# Patient Record
Sex: Female | Born: 1970 | Race: White | Hispanic: No | Marital: Married | Smoking: Never smoker
Health system: Southern US, Community
[De-identification: ages and names within clinical notes are randomized; demographics above are authoritative.]

## PROBLEM LIST (undated history)

## (undated) DIAGNOSIS — N870 Mild cervical dysplasia: Secondary | ICD-10-CM

## (undated) DIAGNOSIS — G43009 Migraine without aura, not intractable, without status migrainosus: Secondary | ICD-10-CM

## (undated) DIAGNOSIS — R011 Cardiac murmur, unspecified: Secondary | ICD-10-CM

## (undated) DIAGNOSIS — I370 Nonrheumatic pulmonary valve stenosis: Secondary | ICD-10-CM

## (undated) DIAGNOSIS — Q256 Stenosis of pulmonary artery: Secondary | ICD-10-CM

## (undated) HISTORY — DX: Migraine without aura, not intractable, without status migrainosus: G43.009

## (undated) HISTORY — DX: Nonrheumatic pulmonary valve stenosis: I37.0

## (undated) HISTORY — DX: Mild cervical dysplasia: N87.0

## (undated) HISTORY — DX: Stenosis of pulmonary artery: Q25.6

## (undated) HISTORY — DX: Cardiac murmur, unspecified: R01.1

---
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
## 2008-10-22 DIAGNOSIS — N870 Mild cervical dysplasia: Secondary | ICD-10-CM

## 2008-10-22 HISTORY — DX: Mild cervical dysplasia: N87.0

## 2008-11-03 ENCOUNTER — Other Ambulatory Visit: Admission: RE | Admit: 2008-11-03 | Discharge: 2008-11-03 | Payer: Self-pay | Admitting: Obstetrics and Gynecology

## 2009-02-04 ENCOUNTER — Other Ambulatory Visit: Admission: RE | Admit: 2009-02-04 | Discharge: 2009-02-04 | Payer: Self-pay | Admitting: Obstetrics and Gynecology

## 2010-08-09 ENCOUNTER — Ambulatory Visit (HOSPITAL_COMMUNITY): Admission: RE | Admit: 2010-08-09 | Discharge: 2010-08-09 | Payer: Self-pay | Admitting: Obstetrics and Gynecology

## 2010-12-04 ENCOUNTER — Institutional Professional Consult (permissible substitution): Payer: Self-pay | Admitting: Cardiology

## 2011-01-03 ENCOUNTER — Institutional Professional Consult (permissible substitution): Payer: Self-pay | Admitting: Cardiology

## 2011-01-04 ENCOUNTER — Institutional Professional Consult (permissible substitution): Payer: Self-pay | Admitting: Cardiology

## 2011-02-07 ENCOUNTER — Encounter: Payer: Self-pay | Admitting: Cardiology

## 2011-02-07 ENCOUNTER — Ambulatory Visit (INDEPENDENT_AMBULATORY_CARE_PROVIDER_SITE_OTHER): Payer: BC Managed Care – PPO | Admitting: Cardiology

## 2011-02-07 VITALS — BP 110/84 | HR 69 | Ht 64.0 in | Wt 130.2 lb

## 2011-02-07 DIAGNOSIS — R011 Cardiac murmur, unspecified: Secondary | ICD-10-CM | POA: Insufficient documentation

## 2011-02-07 DIAGNOSIS — I379 Nonrheumatic pulmonary valve disorder, unspecified: Secondary | ICD-10-CM

## 2011-02-07 DIAGNOSIS — I370 Nonrheumatic pulmonary valve stenosis: Secondary | ICD-10-CM | POA: Insufficient documentation

## 2011-02-07 NOTE — Patient Instructions (Signed)
We will schedule you for an Echocardiogram to follow up on your pulmonic stenosis.  If stable I would just recommend follow up every 5 years unless you have new symptoms.

## 2011-02-07 NOTE — Progress Notes (Signed)
   Dawn Booth Date of Birth: 1971/09/15   History of Present Illness: Dawn Booth is a very pleasant 40 year old white female who is self-referred for evaluation of pulmonic stenosis. She had a murmur noted at birth. At age 4 she underwent cardiac catheterization. She has been diagnosed with mild pulmonic stenosis. Her last evaluation was in 2008 which demonstrated a peak velocity across the aortic valve of 1.5 m/s. Right-sided chambers were normal at that time. She denies any cardiac complaints. She remains very active. She denies any chest pain, shortness of breath, palpitations, or dizziness. She's had no edema. Her weight has been stable.  Current Outpatient Prescriptions on File Prior to Visit  Medication Sig Dispense Refill  . drospirenone-ethinyl estradiol (YAZ) 3-0.02 MG per tablet Take 1 tablet by mouth daily.  30 tablet      Allergies  Allergen Reactions  . Codeine     Past Medical History  Diagnosis Date  . Pulmonary stenosis   . Heart murmur     Past Surgical History  Procedure Date  . Cardiac catheterization 1975    History  Smoking status  . Never Smoker   Smokeless tobacco  . Not on file    History  Alcohol Use  . Yes    OCCASSIONALLY    History reviewed. No pertinent family history.  Review of Systems:  All other systems were reviewed and are negative.  Physical Exam: BP 110/84  Pulse 69  Ht 5\' 4"  (1.626 m)  Wt 130 lb 3.2 oz (59.058 kg)  BMI 22.35 kg/m2 The patient is alert and oriented x 3.  The mood and affect are normal.  The skin is warm and dry.  Color is normal.  The HEENT exam reveals that the sclera are nonicteric.  The mucous membranes are moist.  The carotids are 2+ without bruits.  There is no thyromegaly.  There is no JVD.  The lungs are clear.  The chest wall is non tender.  The heart exam reveals a regular rate with a normal S1 and S2.  There is a grade 2/6 systolic ejection murmur heard best at the left upper sternal border. There is no  diastolic murmur. There is no right ventricular lift. The PMI is not displaced.   Abdominal exam reveals good bowel sounds.  There is no guarding or rebound.  There is no hepatosplenomegaly or tenderness.  There are no masses.  Exam of the legs reveal no clubbing, cyanosis, or edema.  The legs are without rashes.  The distal pulses are intact.  Cranial nerves II - XII are intact.  Motor and sensory functions are intact.  The gait is normal.  LABORATORY DATA: ECG demonstrates normal sinus rhythm with a rate of 69 beats per minute. It is a normal ECG.  Assessment / Plan:

## 2011-02-07 NOTE — Assessment & Plan Note (Signed)
Exam is consistent with mild pulmonic stenosis. We will obtain an echocardiogram to followup. If this is stable then I would recommend repeat evaluation in 5 years. The patient has been using SBE prophylaxis but I informed her that based on the current guidelines this was no longer recommended.

## 2011-02-15 ENCOUNTER — Ambulatory Visit (HOSPITAL_COMMUNITY): Payer: BC Managed Care – PPO | Attending: Cardiology

## 2011-02-15 DIAGNOSIS — I379 Nonrheumatic pulmonary valve disorder, unspecified: Secondary | ICD-10-CM | POA: Insufficient documentation

## 2011-02-15 DIAGNOSIS — I370 Nonrheumatic pulmonary valve stenosis: Secondary | ICD-10-CM

## 2011-02-16 ENCOUNTER — Telehealth: Payer: Self-pay | Admitting: *Deleted

## 2011-02-16 NOTE — Telephone Encounter (Signed)
Notified of echo results and needs repeat echo in 4-5 yrs. No PCP

## 2011-02-16 NOTE — Telephone Encounter (Signed)
Message copied by Murrell Redden on Fri Feb 16, 2011  2:40 PM ------      Message from: [place], PETER      Created: Thu Feb 15, 2011  4:40 PM       Mild pulmonic stenosis. No change in gradient from prior exam. Right heart is normal.      Recommend follow up Echo/doppler in 4-5 years.

## 2011-05-03 ENCOUNTER — Encounter: Payer: Self-pay | Admitting: Cardiology

## 2011-08-17 ENCOUNTER — Other Ambulatory Visit: Payer: Self-pay | Admitting: Obstetrics and Gynecology

## 2011-08-17 DIAGNOSIS — Z1231 Encounter for screening mammogram for malignant neoplasm of breast: Secondary | ICD-10-CM

## 2011-09-17 ENCOUNTER — Ambulatory Visit (HOSPITAL_COMMUNITY)
Admission: RE | Admit: 2011-09-17 | Discharge: 2011-09-17 | Disposition: A | Discharge status: Home / Self Care | Payer: No Typology Code available for payment source | Source: Ambulatory Visit | Attending: Obstetrics and Gynecology | Admitting: Obstetrics and Gynecology

## 2011-09-17 DIAGNOSIS — Z1231 Encounter for screening mammogram for malignant neoplasm of breast: Secondary | ICD-10-CM | POA: Insufficient documentation

## 2012-09-03 ENCOUNTER — Other Ambulatory Visit: Payer: Self-pay | Admitting: Obstetrics and Gynecology

## 2012-09-03 DIAGNOSIS — Z1231 Encounter for screening mammogram for malignant neoplasm of breast: Secondary | ICD-10-CM

## 2012-10-06 ENCOUNTER — Ambulatory Visit (HOSPITAL_COMMUNITY)
Admission: RE | Admit: 2012-10-06 | Discharge: 2012-10-06 | Disposition: A | Discharge status: Home / Self Care | Payer: No Typology Code available for payment source | Source: Ambulatory Visit | Attending: Obstetrics and Gynecology | Admitting: Obstetrics and Gynecology

## 2012-10-06 DIAGNOSIS — Z1231 Encounter for screening mammogram for malignant neoplasm of breast: Secondary | ICD-10-CM | POA: Insufficient documentation

## 2013-03-30 ENCOUNTER — Encounter: Payer: Self-pay | Admitting: Obstetrics and Gynecology

## 2013-03-30 DIAGNOSIS — Q256 Stenosis of pulmonary artery: Secondary | ICD-10-CM | POA: Insufficient documentation

## 2013-04-03 ENCOUNTER — Ambulatory Visit (INDEPENDENT_AMBULATORY_CARE_PROVIDER_SITE_OTHER): Payer: No Typology Code available for payment source | Admitting: Gynecology

## 2013-04-03 ENCOUNTER — Ambulatory Visit: Payer: Self-pay | Admitting: Obstetrics and Gynecology

## 2013-04-03 ENCOUNTER — Encounter: Payer: Self-pay | Admitting: Gynecology

## 2013-04-03 VITALS — BP 110/64 | HR 80 | Resp 18 | Ht 64.5 in | Wt 130.0 lb

## 2013-04-03 DIAGNOSIS — Z124 Encounter for screening for malignant neoplasm of cervix: Secondary | ICD-10-CM

## 2013-04-03 DIAGNOSIS — Z0000 Encounter for general adult medical examination without abnormal findings: Secondary | ICD-10-CM

## 2013-04-03 DIAGNOSIS — Z01419 Encounter for gynecological examination (general) (routine) without abnormal findings: Secondary | ICD-10-CM

## 2013-04-03 LAB — POCT URINALYSIS DIPSTICK
Urobilinogen, UA: NEGATIVE
pH, UA: 5

## 2013-04-03 NOTE — Patient Instructions (Addendum)

## 2013-04-03 NOTE — Progress Notes (Signed)
42 y.o. Married Caucasian female  G2P2, was on yaz to treat migraines but stopped last year and cycles have been normal, migraines, spotting better here for annual exam. Pt is currently sexually active.    Patient's last menstrual period was 03/07/2013.          Sexually active: yes  The current method of family planning is vasectomy.    Exercising: yes  crossfit 3x/wk Last pap: 11/28/10 Alcohol: 3 drinks/wk Tobacco: no BSE: no  Hgb: 13.6 Urine: Trace Leuks   Health Maintenance  Topic Date Due  . Influenza Vaccine  06/22/2013  . Pap Smear  11/28/2013  . Tetanus/tdap  08/06/2019    No family history on file.  Patient Active Problem List   Diagnosis Date Noted  . Congenital pulmonary stenosis   . Pulmonary stenosis   . Heart murmur     Past Medical History  Diagnosis Date  . Pulmonary stenosis   . Heart murmur   . 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 Congenital pulmonary stenosis   . Migraine with aura     Past Surgical History  Procedure Laterality Date  . Cardiac catheterization  1975    Allergies: Codeine  Current Outpatient Prescriptions  Medication Sig Dispense Refill  . IBUPROFEN PO Take by mouth.      . levocetirizine (XYZAL) 2.5 MG/5ML solution Take 5 mLs (2.5 mg total) by mouth every evening.  148 mL    . SUMAtriptan (IMITREX) 100 MG tablet       . drospirenone-ethinyl estradiol (YAZ) 3-0.02 MG per tablet Take 1 tablet by mouth daily.  30 tablet     No current facility-administered medications for this visit.    ROS: Pertinent items are noted in HPI.  Exam:    BP 110/64  Pulse 80  Resp 18  Ht 5' 4.5" (1.638 m)  Wt 130 lb (58.968 kg)  BMI 21.98 kg/m2  LMP 03/07/2013 Weight change: @WEIGHTCHANGE @ Last 3 height recordings:  Ht Readings from Last 3 Encounters:  04/03/13 5' 4.5" (1.638 m)  02/07/11 5\' 4"  (1.626 m)   General appearance: alert, cooperative and appears stated age Head: Normocephalic, without  obvious abnormality, atraumatic Neck: no adenopathy, no carotid bruit, no JVD, supple, symmetrical, trachea midline and thyroid not enlarged, symmetric, no tenderness/mass/nodules Lungs: clear to auscultation bilaterally Breasts: normal appearance, no masses or tenderness Heart: regular rate and rhythm, S1, S2 normal, no murmur, click, rub or gallop Abdomen: soft, non-tender; bowel sounds normal; no masses,  no organomegaly Extremities: extremities normal, atraumatic, no cyanosis or edema Skin: Skin color, texture, turgor normal. No rashes or lesions Lymph nodes: Cervical, supraclavicular, and axillary nodes normal. no inguinal nodes palpated Neurologic: Grossly normal   Pelvic: External genitalia:  no lesions              Urethra: normal appearing urethra with no masses, tenderness or lesions              Bartholins and Skenes: normal                 Vagina: normal appearing vagina with normal color and discharge, no lesions              Cervix: normal appearance              Pap taken: no        Bimanual Exam:  Uterus:  uterus is normal size, shape, consistency and  nontender                                      Adnexa:    normal adnexa in size, nontender and no masses                                      Rectovaginal: Confirms                                      Anus:  normal sphincter tone, no lesions  A: well woman Pulmonary stenosis-stable     P: mammogram q year PAP with HPV due 2015 return annually or prn   An After Visit Summary was printed and given to the patient.

## 2013-04-08 NOTE — Addendum Note (Signed)
Addended by: Clide Dales R on: 04/08/2013 09:51 AM   Modules accepted: Orders

## 2013-04-25 ENCOUNTER — Other Ambulatory Visit (HOSPITAL_COMMUNITY): Payer: Self-pay | Admitting: Obstetrics and Gynecology

## 2013-04-27 MED ORDER — SUMATRIPTAN SUCCINATE 100 MG PO TABS: ORAL_TABLET | ORAL | Status: DC

## 2013-04-27 NOTE — Addendum Note (Signed)
Addended by: Douglass Rivers on: 04/27/2013 06:31 PM   Modules accepted: Orders

## 2013-04-27 NOTE — Telephone Encounter (Signed)
Patient was just seen for AEX 04/03/13 no rx was given/sent through please advise.

## 2013-10-26 ENCOUNTER — Other Ambulatory Visit: Payer: Self-pay | Admitting: Gynecology

## 2013-10-26 DIAGNOSIS — Z1231 Encounter for screening mammogram for malignant neoplasm of breast: Secondary | ICD-10-CM

## 2013-11-03 ENCOUNTER — Ambulatory Visit (HOSPITAL_COMMUNITY)
Admission: RE | Admit: 2013-11-03 | Discharge: 2013-11-03 | Disposition: A | Discharge status: Home / Self Care | Payer: No Typology Code available for payment source | Source: Ambulatory Visit | Attending: Gynecology | Admitting: Gynecology

## 2013-11-03 DIAGNOSIS — Z1231 Encounter for screening mammogram for malignant neoplasm of breast: Secondary | ICD-10-CM | POA: Insufficient documentation

## 2014-03-24 ENCOUNTER — Other Ambulatory Visit (HOSPITAL_COMMUNITY): Payer: Self-pay | Admitting: Gynecology

## 2014-03-24 NOTE — Telephone Encounter (Signed)
Last AEX 04/03/13 Last refill 04/27/13 #27/ 0 refills Next appt 04/05/14  Please approve or deny Rx.

## 2014-04-05 ENCOUNTER — Ambulatory Visit: Payer: No Typology Code available for payment source | Admitting: Gynecology

## 2014-04-14 ENCOUNTER — Ambulatory Visit (INDEPENDENT_AMBULATORY_CARE_PROVIDER_SITE_OTHER): Payer: No Typology Code available for payment source | Admitting: Gynecology

## 2014-04-14 ENCOUNTER — Encounter: Payer: Self-pay | Admitting: Gynecology

## 2014-04-14 VITALS — Ht 64.75 in | Wt 130.0 lb

## 2014-04-14 DIAGNOSIS — Z124 Encounter for screening for malignant neoplasm of cervix: Secondary | ICD-10-CM

## 2014-04-14 DIAGNOSIS — Z0000 Encounter for general adult medical examination without abnormal findings: Secondary | ICD-10-CM

## 2014-04-14 DIAGNOSIS — Z01419 Encounter for gynecological examination (general) (routine) without abnormal findings: Secondary | ICD-10-CM

## 2014-04-14 DIAGNOSIS — N926 Irregular menstruation, unspecified: Secondary | ICD-10-CM

## 2014-04-14 DIAGNOSIS — N921 Excessive and frequent menstruation with irregular cycle: Secondary | ICD-10-CM

## 2014-04-14 LAB — POCT URINALYSIS DIPSTICK
Leukocytes, UA: NEGATIVE
UROBILINOGEN UA: NEGATIVE
pH, UA: 5

## 2014-04-14 LAB — HEMOGLOBIN, FINGERSTICK: HEMOGLOBIN, FINGERSTICK: 13 g/dL (ref 12.0–16.0)

## 2014-04-14 NOTE — Progress Notes (Signed)
43 y.o. Married Caucasian female   G2P2002 here for annual exam. Pt is currently sexually active.  Periods are now more irregular, every 2w-4w, 4d of flow and no dysmeorrhea  Patient's last menstrual period was 03/09/2014.          Sexually active: yes  The current method of family planning is Husband has Vasectomy.    Exercising: yes  crossfit, cardio, weights 4x/wk Last pap: 11/28/10 NEG, 02/04/09 NEG HR HPV Alcohol:2-3 drinks/wk Tobacco: no BSE: yes  Last Mammogram:11/04/13 Bi-Rads 1  Hgb: 13.0 ; Urine: Negative   Health Maintenance  Topic Date Due  . Influenza Vaccine  05/22/2014  . Pap Smear  04/03/2016  . Tetanus/tdap  08/06/2019    History reviewed. No pertinent family history.  Patient Active Problem List   Diagnosis Date Noted  . Congenital pulmonary stenosis   . Pulmonary stenosis   . Heart murmur     Past Medical History  Diagnosis Date  . Pulmonary stenosis   . Heart murmur   . 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 Congenital pulmonary stenosis   . Migraine with aura     Past Surgical History  Procedure Laterality Date  . Cardiac catheterization  1975    Allergies: Codeine  Current Outpatient Prescriptions  Medication Sig Dispense Refill  . levocetirizine (XYZAL) 2.5 MG/5ML solution Take 5 mLs (2.5 mg total) by mouth every evening.  148 mL    . SUMAtriptan (IMITREX) 100 MG tablet TAKE ONE TABLET BY MOUTH AT ONSET OF HEADACHE  27 tablet  0  . IBUPROFEN PO Take by mouth.       No current facility-administered medications for this visit.    ROS: Pertinent items are noted in HPI.  Exam:    Ht 5' 4.75" (1.645 m)  Wt 130 lb (58.968 kg)  BMI 21.79 kg/m2  LMP 03/09/2014 Weight change: @WEIGHTCHANGE @ Last 3 height recordings:  Ht Readings from Last 3 Encounters:  04/14/14 5' 4.75" (1.645 m)  04/03/13 5' 4.5" (1.638 m)  02/07/11 5\' 4"  (1.626 m)   General appearance: alert, cooperative and appears stated age Head:  Normocephalic, without obvious abnormality, atraumatic Neck: no adenopathy, no carotid bruit, no JVD, supple, symmetrical, trachea midline and thyroid not enlarged, symmetric, no tenderness/mass/nodules Lungs: clear to auscultation bilaterally Breasts: normal appearance, no masses or tenderness Heart: regular rate and rhythm, S1, S2 normal, no murmur, click, rub or gallop Abdomen: soft, non-tender; bowel sounds normal; no masses,  no organomegaly Extremities: extremities normal, atraumatic, no cyanosis or edema Skin: Skin color, texture, turgor normal. No rashes or lesions Lymph nodes: Cervical, supraclavicular, and axillary nodes normal. no inguinal nodes palpated Neurologic: Grossly normal   Pelvic: External genitalia:  normal escutcheon              Urethra: normal appearing urethra with no masses, tenderness or lesions              Bartholins and Skenes: Bartholin's, Urethra, Skene's normal                 Vagina: normal appearing vagina with normal color and discharge, no lesions              Cervix: normal appearance              Pap taken: yes        Bimanual Exam:  Uterus:  uterus is normal size, shape, consistency and nontender  Adnexa:    normal adnexa in size, nontender and no masses                                      Rectovaginal: Confirms                                      Anus:  normal sphincter tone, no lesions     1. Routine gynecological examination counseled on breast self exam, mammography screening, adequate intake of calcium and vitamin D, diet and exercise return annually or prn   2. Laboratory examination ordered as part of a routine general medical examination  - POCT Urinalysis Dipstick - Hemoglobin, fingerstick  3. Screening for cervical cancer  - Pap Test with HP (IPS)  4. Irregular menses Pt with history of irregular cycles, did well initially off ocp, now worse, discussed ddx including anovulation, uterine  pathology Will consider restarting ocp if studies normal, risks and benefits reviewed, no contraindications with pulmonary stenosis  - TSH - Prolactin  5. Metrorrhagia  - US Sonohysterogram; Future   An After Visit Summary was printed and given to the patient.

## 2014-04-15 LAB — PROLACTIN: Prolactin: 8.7 ng/mL

## 2014-04-15 LAB — TSH: TSH: 3.232 u[IU]/mL (ref 0.350–4.500)

## 2014-04-16 ENCOUNTER — Telehealth: Payer: Self-pay | Admitting: *Deleted

## 2014-04-16 LAB — IPS PAP TEST WITH HPV

## 2014-04-16 NOTE — Telephone Encounter (Signed)
Left Message To Call Back  

## 2014-04-16 NOTE — Telephone Encounter (Signed)
Message copied by Lorraine LaxSHAW, JASMINE J on Fri Apr 16, 2014  1:28 PM ------      Message from: Douglass RiversLATHROP, TRACY      Created: Fri Apr 16, 2014  1:14 PM       Labs are normal, keep sonohysterogram appt ------

## 2014-04-20 NOTE — Telephone Encounter (Signed)
Left Message To Call Back  

## 2014-04-20 NOTE — Telephone Encounter (Signed)
Message copied by Lorraine LaxSHAW, Angelika Jerrett J on Tue Apr 20, 2014  8:53 AM ------      Message from: Douglass RiversLATHROP, TRACY      Created: Fri Apr 16, 2014  1:14 PM       Labs are normal, keep sonohysterogram appt ------

## 2014-04-22 ENCOUNTER — Telehealth: Payer: Self-pay | Admitting: Gynecology

## 2014-04-22 NOTE — Telephone Encounter (Signed)
Left Message To Call Back  

## 2014-04-22 NOTE — Telephone Encounter (Signed)
Spoke with patient. Advised that per benefit quote received, she will be responsible for [facility]868.83 [facility] Patient agreeable. Scheduled [facility]. Advised patient of 72 hour cancellation policy and $150 cancellation fee. Patient agreeable Mailed the In-Office procedure form that includes appointment date and time, patient copay, and cancellation policy.

## 2014-04-22 NOTE — Telephone Encounter (Signed)
Left message for patient to call back. Need to go over benefits and scheduled [facility]. °

## 2014-04-26 ENCOUNTER — Encounter: Payer: Self-pay | Admitting: *Deleted

## 2014-04-26 NOTE — Telephone Encounter (Signed)
LMTCB on Husband's cell DPR signed in chart since I haven't been able to get a hold of patient.

## 2014-04-26 NOTE — Telephone Encounter (Signed)
Dawn Booth a lady named Dawn Booth more called and said that we have been calling her number to reach this patient [phone] is not Dawn Booth #

## 2014-04-27 NOTE — Telephone Encounter (Signed)
Patient notified see result note 

## 2014-05-11 ENCOUNTER — Other Ambulatory Visit: Payer: No Typology Code available for payment source | Admitting: Gynecology

## 2014-05-11 ENCOUNTER — Ambulatory Visit: Payer: No Typology Code available for payment source

## 2014-05-18 ENCOUNTER — Other Ambulatory Visit: Payer: Self-pay | Admitting: Gynecology

## 2014-05-18 ENCOUNTER — Ambulatory Visit (INDEPENDENT_AMBULATORY_CARE_PROVIDER_SITE_OTHER): Payer: No Typology Code available for payment source

## 2014-05-18 ENCOUNTER — Ambulatory Visit (INDEPENDENT_AMBULATORY_CARE_PROVIDER_SITE_OTHER): Payer: No Typology Code available for payment source | Admitting: Gynecology

## 2014-05-18 ENCOUNTER — Encounter: Payer: Self-pay | Admitting: Gynecology

## 2014-05-18 VITALS — BP 96/60 | Ht 64.75 in | Wt 128.0 lb

## 2014-05-18 DIAGNOSIS — N83209 Unspecified ovarian cyst, unspecified side: Secondary | ICD-10-CM

## 2014-05-18 DIAGNOSIS — N921 Excessive and frequent menstruation with irregular cycle: Secondary | ICD-10-CM

## 2014-05-18 DIAGNOSIS — R1904 Left lower quadrant abdominal swelling, mass and lump: Secondary | ICD-10-CM

## 2014-05-18 DIAGNOSIS — N926 Irregular menstruation, unspecified: Secondary | ICD-10-CM

## 2014-05-18 DIAGNOSIS — N83292 Other ovarian cyst, left side: Secondary | ICD-10-CM

## 2014-05-18 NOTE — Progress Notes (Signed)
      Pt here for irregular menses, cycles every 2-4w, contracepting with vasectomy. PUS images reviewed. uterus is retroverted, NSSC, no fibroids are noted. Left ovary notable for 2 complex cysts with debris, cyst appears immobile and tender-cysts measure 3.5x3.1, 1.5x1.1, simple clear cyst noted as well  Right ovary normal No free fluid  SHG performed: Speculum placed, cervix cleansed with betadine x3, insemination catheter placed and walls distended, no masses noted.  Assessment: Irregular menses Complex ovarian mass  Plan: 1. Reviewed options to treat menses, had done better on ocp in past, pt is not interested in restarting ocp as they are using vasectomy, [facility] 2. Suspect endometrioma on left.  Discussed impact of endometriosis on pelvic pain, fertility and irregular menses.  Pt does not have pain or need fertility.  Can treat with placement of progestin IUD, should confirm with repeat PUS in 2919m.  Discussed endometriosis in general. Questions addressed. Info on IUD given She will consider her options. Additional 453m spent counseling, >50% face to face

## 2014-05-18 NOTE — Patient Instructions (Addendum)
Levonorgestrel intrauterine device (IUD) What is this medicine? LEVONORGESTREL IUD (LEE voe nor jes trel) is a contraceptive (birth control) device. The device is placed inside the uterus by a healthcare professional. It is used to prevent pregnancy and can also be used to treat heavy bleeding that occurs during your period. Depending on the device, it can be used for 3 to 5 years. This medicine may be used for other purposes; ask your health care provider or pharmacist if you have questions. COMMON BRAND NAME(S): LILETTA, Mirena, Skyla What should I tell my health care provider before I take this medicine? They need to know if you have any of these conditions: -abnormal Pap smear -cancer of the breast, uterus, or cervix -diabetes -endometritis -genital or pelvic infection now or in the past -have more than one sexual partner or your partner has more than one partner -heart disease -history of an ectopic or tubal pregnancy -immune system problems -IUD in place -liver disease or tumor -problems with blood clots or take blood-thinners -use intravenous drugs -uterus of unusual shape -vaginal bleeding that has not been explained -an unusual or allergic reaction to levonorgestrel, other hormones, silicone, or polyethylene, medicines, foods, dyes, or preservatives -pregnant or trying to get pregnant -breast-feeding How should I use this medicine? This device is placed inside the uterus by a health care professional. Talk to your pediatrician regarding the use of this medicine in children. Special care may be needed. Overdosage: If you think you have taken too much of this medicine contact a poison control center or emergency room at once. NOTE: This medicine is only for you. Do not share this medicine with others. What if I miss a dose? This does not apply. What may interact with this medicine? Do not take this medicine with any of the following  medications: -amprenavir -bosentan -fosamprenavir This medicine may also interact with the following medications: -aprepitant -barbiturate medicines for inducing sleep or treating seizures -bexarotene -griseofulvin -medicines to treat seizures like carbamazepine, ethotoin, felbamate, oxcarbazepine, phenytoin, topiramate -modafinil -pioglitazone -rifabutin -rifampin -rifapentine -some medicines to treat HIV infection like atazanavir, indinavir, lopinavir, nelfinavir, tipranavir, ritonavir -St. John's wort -warfarin This list may not describe all possible interactions. Give your health care provider a list of all the medicines, herbs, non-prescription drugs, or dietary supplements you use. Also tell them if you smoke, drink alcohol, or use illegal drugs. Some items may interact with your medicine. What should I watch for while using this medicine? Visit your doctor or health care professional for regular check ups. See your doctor if you or your partner has sexual contact with others, becomes HIV positive, or gets a sexual transmitted disease. This product does not protect you against HIV infection (AIDS) or other sexually transmitted diseases. You can check the placement of the IUD yourself by reaching up to the top of your vagina with clean fingers to feel the threads. Do not pull on the threads. It is a good habit to check placement after each menstrual period. Call your doctor right away if you feel more of the IUD than just the threads or if you cannot feel the threads at all. The IUD may come out by itself. You may become pregnant if the device comes out. If you notice that the IUD has come out use a backup birth control method like condoms and call your health care provider. Using tampons will not change the position of the IUD and are okay to use during your period. What side effects may   I notice from receiving this medicine? Side effects that you should report to your doctor or  health care professional as soon as possible: -allergic reactions like skin rash, itching or hives, swelling of the face, lips, or tongue -fever, flu-like symptoms -genital sores -high blood pressure -no menstrual period for 6 weeks during use -pain, swelling, warmth in the leg -pelvic pain or tenderness -severe or sudden headache -signs of pregnancy -stomach cramping -sudden shortness of breath -trouble with balance, talking, or walking -unusual vaginal bleeding, discharge -yellowing of the eyes or skin Side effects that usually do not require medical attention (report to your doctor or health care professional if they continue or are bothersome): -acne -breast pain -change in sex drive or performance -changes in weight -cramping, dizziness, or faintness while the device is being inserted -headache -irregular menstrual bleeding within first 3 to 6 months of use -nausea This list may not describe all possible side effects. Call your doctor for medical advice about side effects. You may report side effects to FDA at 1-800-FDA-1088. Where should I keep my medicine? This does not apply. NOTE: This sheet is a summary. It may not cover all possible information. If you have questions about this medicine, talk to your doctor, pharmacist, or health care provider.  2015, Elsevier/Gold Standard. (2011-11-08 13:54:04)  Endometriosis Endometriosis is a condition in which the tissue that lines the uterus (endometrium) grows outside of its normal location. The tissue may grow in many locations close to the uterus, but it commonly grows on the ovaries, fallopian tubes, vagina, or bowel. Because the uterus expels, or sheds, its lining every menstrual cycle, there is bleeding wherever the endometrial tissue is located. This can cause pain because blood is irritating to tissues not normally exposed to it.  CAUSES  The cause of endometriosis is not known.  SIGNS AND SYMPTOMS  Often, there are no  symptoms. When symptoms are present, they can vary with the location of the displaced tissue. Various symptoms can occur at different times. Although symptoms occur mainly during a woman's menstrual period, they can also occur midcycle and usually stop with menopause. Some people may go months with no symptoms at all. Symptoms may include:   Back or abdominal pain.   Heavier bleeding during periods.   Pain during intercourse.   Painful bowel movements.   Infertility. DIAGNOSIS  Your health care provider will do a physical exam and ask about your symptoms. Various tests may be done, such as:   Blood tests and urine tests. These are done to help rule out other problems.   Ultrasound. This test is done to look for abnormal tissue.   An X-ray of the lower bowel (barium enema).  Laparoscopy. In this procedure, a thin, lighted tube with a tiny camera on the end (laparoscope) is inserted into your abdomen. This helps your health care provider look for abnormal tissue to confirm the diagnosis. The health care provider may also remove a small piece of tissue (biopsy) from any abnormal tissue found. This tissue sample can then be sent to a lab so it can be looked at under a microscope. TREATMENT  Treatment will vary and may include:   Medicines to relieve pain. Nonsteroidal anti-inflammatory drugs (NSAIDs) are a type of pain medicine that can help to relieve the pain caused by endometriosis.  Hormonal therapy. When using hormonal therapy, periods are eliminated. This eliminates the monthly exposure to blood by the displaced endometrial tissue.   Surgery. Surgery may sometimes be done  to remove the abnormal endometrial tissue. In severe cases, surgery may be done to remove the fallopian tubes, uterus, and ovaries (hysterectomy). HOME CARE INSTRUCTIONS   Take all medicines as directed by your health care provider. Do not take aspirin because it may increase bleeding when you are not on  hormonal therapy.   Avoid activities that produce pain, including sexual activity. SEEK MEDICAL CARE IF:  You have pelvic pain before, after, or during your periods.  You have pelvic pain between periods that gets worse during your period.  You have pelvic pain during or after sex.  You have pelvic pain with bowel movements or urination, especially during your period.  You have problems getting pregnant.  You have a fever. SEEK IMMEDIATE MEDICAL CARE IF:   Your pain is severe and is not responding to pain medicine.   You have severe nausea and vomiting, or you cannot keep foods down.   You have pain that is limited to the right lower part of your abdomen.   You have swelling or increasing pain in your abdomen.   You see blood in your stool.  MAKE SURE YOU:   Understand these instructions.  Will watch your condition.  Will get help right away if you are not doing well or get worse. Document Released: 10/05/2000 Document Revised: 02/22/2014 Document Reviewed: 06/05/2013 [facility] Patient Information 2015 [place], [place]. This information is not intended to replace advice given to you by your health care provider. Make sure you discuss any questions you have with your health care provider.

## 2014-08-23 ENCOUNTER — Encounter: Payer: Self-pay | Admitting: Gynecology

## 2014-09-22 ENCOUNTER — Telehealth: Payer: Self-pay | Admitting: Gynecology

## 2014-09-22 NOTE — Telephone Encounter (Signed)
LMTCB about canceled appointment °

## 2014-10-05 ENCOUNTER — Other Ambulatory Visit: Payer: Self-pay | Admitting: Obstetrics & Gynecology

## 2014-10-05 DIAGNOSIS — Z1231 Encounter for screening mammogram for malignant neoplasm of breast: Secondary | ICD-10-CM

## 2014-11-09 ENCOUNTER — Other Ambulatory Visit: Payer: Self-pay | Admitting: *Deleted

## 2014-11-09 MED ORDER — SUMATRIPTAN SUCCINATE 100 MG PO TABS: ORAL_TABLET | ORAL | Status: DC

## 2014-11-09 NOTE — Telephone Encounter (Signed)
Medication refill request: Sumatriptan (Imitrex) 100 mg  Last AEX:  04/14/14 with Dr. Farrel GobbleLathrop Next AEX: 05/06/15 with Dr. Hyacinth MeekerMiller Last MMG (if hormonal medication request): N/A Refill authorized: #27/? rfs, please advise

## 2014-11-15 ENCOUNTER — Ambulatory Visit (HOSPITAL_COMMUNITY): Payer: No Typology Code available for payment source

## 2014-11-17 ENCOUNTER — Ambulatory Visit (HOSPITAL_COMMUNITY)
Admission: RE | Admit: 2014-11-17 | Discharge: 2014-11-17 | Disposition: A | Discharge status: Home / Self Care | Payer: No Typology Code available for payment source | Source: Ambulatory Visit | Attending: Obstetrics & Gynecology | Admitting: Obstetrics & Gynecology

## 2014-11-17 DIAGNOSIS — Z1231 Encounter for screening mammogram for malignant neoplasm of breast: Secondary | ICD-10-CM | POA: Diagnosis present

## 2015-04-18 ENCOUNTER — Ambulatory Visit: Payer: No Typology Code available for payment source | Admitting: Gynecology

## 2015-05-06 ENCOUNTER — Ambulatory Visit: Payer: No Typology Code available for payment source | Admitting: Obstetrics & Gynecology

## 2015-05-09 ENCOUNTER — Encounter: Payer: Self-pay | Admitting: Obstetrics and Gynecology

## 2015-05-09 ENCOUNTER — Ambulatory Visit (INDEPENDENT_AMBULATORY_CARE_PROVIDER_SITE_OTHER): Payer: No Typology Code available for payment source | Admitting: Obstetrics and Gynecology

## 2015-05-09 VITALS — BP 122/70 | HR 64 | Resp 14 | Wt 137.0 lb

## 2015-05-09 DIAGNOSIS — N83202 Unspecified ovarian cyst, left side: Secondary | ICD-10-CM

## 2015-05-09 DIAGNOSIS — Z0000 Encounter for general adult medical examination without abnormal findings: Secondary | ICD-10-CM

## 2015-05-09 DIAGNOSIS — N8320 Unspecified ovarian cysts: Secondary | ICD-10-CM | POA: Diagnosis not present

## 2015-05-09 DIAGNOSIS — G43009 Migraine without aura, not intractable, without status migrainosus: Secondary | ICD-10-CM | POA: Diagnosis not present

## 2015-05-09 DIAGNOSIS — Z01419 Encounter for gynecological examination (general) (routine) without abnormal findings: Secondary | ICD-10-CM

## 2015-05-09 LAB — CBC
HCT: 37.9 % (ref 36.0–46.0)
Hemoglobin: 12.5 g/dL (ref 12.0–15.0)
MCH: 32.6 pg (ref 26.0–34.0)
MCHC: 33 g/dL (ref 30.0–36.0)
MCV: 99 fL (ref 78.0–100.0)
MPV: 10.2 fL (ref 8.6–12.4)
Platelets: 284 10*3/uL (ref 150–400)
RBC: 3.83 MIL/uL — AB (ref 3.87–5.11)
RDW: 12.8 % (ref 11.5–15.5)
WBC: 5.6 10*3/uL (ref 4.0–10.5)

## 2015-05-09 LAB — POCT URINALYSIS DIPSTICK
Bilirubin, UA: NEGATIVE
Blood, UA: NEGATIVE
GLUCOSE UA: NEGATIVE
Ketones, UA: NEGATIVE
Leukocytes, UA: NEGATIVE
Nitrite, UA: NEGATIVE
PROTEIN UA: NEGATIVE
SPEC GRAV UA: 1.02
UROBILINOGEN UA: NEGATIVE
pH, UA: 6.5

## 2015-05-09 NOTE — Progress Notes (Signed)
Patient ID: Dawn Booth,[name] female   DOB: 09/02/1971, 44 y.o.   MRN: [id number] 44 y.o. 292P2002 Married Caucasian female here for annual exam.  Last year she had an ultrasound/sonohysterogram for AUB, incidentally noted to have a complex left ovarian cyst. She never came back for a f/u ultrasound. Menses q 4-6 weeks x 4 days. She changes a pad in 2-3 hours. Lighter than they used to be. No BTB. On occasion she has a 2 week cycle (reason for the sonohysterogram). Cramps start prior to her cycle, last x 24 hours, helped with ibuprofen. No dyspareunia, vasectomy for contraception. No vasomotor symptoms or vaginal dryness.   PCP:  No PCP  Patient's last menstrual period was 04/06/2015.          Sexually active: Yes.    The current method of family planning is vasectomy.    Exercising: Yes.    crossfit Smoker:  no  Health Maintenance: Pap:  04-14-14 WNL NEG HR HPV History of abnormal Pap:  LSIL MMG:  11-17-14  Colonoscopy:  N/A BMD:   N/A TDaP:  08-05-09 Screening Labs: labs drawn today Hb today: 12.6, Urine today: WNL   reports that she has never smoked. She has never used smokeless tobacco. She reports that she drinks alcohol. She reports that she does not use illicit drugs.  Past Medical History  Diagnosis Date  . Pulmonary stenosis   . Heart murmur   . 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 Congenital pulmonary stenosis   . Migraine with aura     Past Surgical History  Procedure Laterality Date  . Cardiac catheterization  1975    Current Outpatient Prescriptions  Medication Sig Dispense Refill  . IBUPROFEN PO Take by mouth.    . levocetirizine (XYZAL) 2.5 MG/5ML solution Take 5 mLs (2.5 mg total) by mouth every evening. 148 mL   . SUMAtriptan (IMITREX) 100 MG tablet TAKE ONE TABLET BY MOUTH AT ONSET OF HEADACHE 27 tablet 1   No current facility-administered medications for this visit.    History reviewed. No pertinent family history.  ROS:   Pertinent items are noted in HPI.  Otherwise, a comprehensive ROS was negative.  Exam:   BP 122/70 mmHg  Pulse 64  Resp 14  Wt 137 lb (62.143 kg)  LMP 04/06/2015    General appearance: alert, cooperative and appears stated age Head: Normocephalic, without obvious abnormality, atraumatic Neck: no adenopathy, supple, symmetrical, trachea midline and thyroid normal to inspection and palpation Lungs: clear to auscultation bilaterally Breasts: normal appearance, no masses or tenderness Heart: regular rate and rhythm Abdomen: soft, non-tender; bowel sounds normal; no masses,  no organomegaly Extremities: extremities normal, atraumatic, no cyanosis or edema Skin: Skin color, texture, turgor normal. No rashes or lesions Lymph nodes: Cervical, supraclavicular, and axillary nodes normal. No abnormal inguinal nodes palpated Neurologic: Grossly normal  Pelvic: External genitalia:  no lesions              Urethra:  normal appearing urethra with no masses, tenderness or lesions              Bartholins and Skenes: normal                 Vagina: normal appearing vagina with normal color and discharge, no lesions              Cervix: nabothian cyst  Pap taken: No. Bimanual Exam:  Uterus:  normal size, contour, position, consistency, mobility, non-tender and retroverted              Adnexa: normal adnexa and no mass, fullness, tenderness              Rectovaginal: Yes.  .  Confirms.              Anus:  normal sphincter tone, no lesions  Chaperone was present for exam.  Assessment:   Well woman visit with normal exam. H/O left ovarian cyst  Plan: Yearly mammogram recommended after age 27. Mammogram is UTD Return for a gyn ultrasound Labs performed.  CBC, lipids, Vit D Follow up annually and prn.  No pap this year  After visit summary provided.

## 2015-05-10 ENCOUNTER — Telehealth: Payer: Self-pay | Admitting: Obstetrics and Gynecology

## 2015-05-10 LAB — LIPID PANEL
CHOLESTEROL: 167 mg/dL (ref 0–200)
HDL: 83 mg/dL (ref >=46)
LDL Cholesterol: 62 mg/dL (ref 0–99)
TRIGLYCERIDES: 108 mg/dL (ref <150)
Total CHOL/HDL Ratio: 2 Ratio
VLDL: 22 mg/dL (ref 0–40)

## 2015-05-10 LAB — HEMOGLOBIN, FINGERSTICK: Hemoglobin, fingerstick: 12.6 g/dL (ref 12.0–16.0)

## 2015-05-10 LAB — VITAMIN D 25 HYDROXY (VIT D DEFICIENCY, FRACTURES): VIT D 25 HYDROXY: 28 ng/mL — AB (ref 30–100)

## 2015-05-10 NOTE — Telephone Encounter (Signed)
I called patient back and gave lab results -eh

## 2015-05-10 NOTE — Telephone Encounter (Signed)
Returning a call to Elaine.  °

## 2015-05-26 ENCOUNTER — Telehealth: Payer: Self-pay | Admitting: Obstetrics and Gynecology

## 2015-05-26 NOTE — Telephone Encounter (Signed)
Verified patients benefits with insurance. Contacted patient and spoke with her. Reviewed benefit. Patient understands and agreeable. Patient unavailable for next open pus schedule. Patient requests appointment in late September. Patient scheduled for 07/19/15 with Dr Oscar La. Patient agreeable to benefit and appointment. Ok to close pending review from [place].

## 2015-05-30 NOTE — Telephone Encounter (Signed)
Annual exam with Dr Oscar La 05-09-15.  Patient noted to have complex ovarian cyst on ultrasound last year; she did not return to for follow-up. Appointment scheduled for 07-19-15 for pelvic ultrasound per patient request. Is this appointment acceptable?

## 2015-05-30 NOTE — Telephone Encounter (Signed)
Yes, it has been over a year, her exam was normal. I don't think another month will make a difference.

## 2015-05-31 NOTE — Telephone Encounter (Signed)
Encounter closed

## 2015-07-19 ENCOUNTER — Other Ambulatory Visit: Payer: No Typology Code available for payment source

## 2015-07-19 ENCOUNTER — Other Ambulatory Visit: Payer: No Typology Code available for payment source | Admitting: Obstetrics and Gynecology

## 2015-10-02 ENCOUNTER — Other Ambulatory Visit: Payer: Self-pay | Admitting: Obstetrics & Gynecology

## 2015-10-03 NOTE — Telephone Encounter (Signed)
Medication refill request: Imitrex Last AEX:  05-09-15 Next AEX: 05-09-16 Last MMG (if hormonal medication request): 11-17-14 WNL Refill authorized: please advise

## 2015-11-09 ENCOUNTER — Other Ambulatory Visit: Payer: Self-pay

## 2015-11-09 DIAGNOSIS — Z1231 Encounter for screening mammogram for malignant neoplasm of breast: Secondary | ICD-10-CM

## 2015-11-28 ENCOUNTER — Ambulatory Visit
Admission: RE | Admit: 2015-11-28 | Discharge: 2015-11-28 | Disposition: A | Discharge status: Home / Self Care | Payer: Managed Care, Other (non HMO) | Source: Ambulatory Visit

## 2015-11-28 DIAGNOSIS — Z1231 Encounter for screening mammogram for malignant neoplasm of breast: Secondary | ICD-10-CM

## 2016-05-09 ENCOUNTER — Ambulatory Visit (INDEPENDENT_AMBULATORY_CARE_PROVIDER_SITE_OTHER): Payer: Managed Care, Other (non HMO) | Admitting: Obstetrics and Gynecology

## 2016-05-09 ENCOUNTER — Encounter: Payer: Self-pay | Admitting: Obstetrics and Gynecology

## 2016-05-09 ENCOUNTER — Other Ambulatory Visit: Payer: Self-pay | Admitting: Obstetrics and Gynecology

## 2016-05-09 VITALS — BP 94/70 | HR 56 | Resp 16 | Ht 64.0 in | Wt 135.0 lb

## 2016-05-09 DIAGNOSIS — Z8742 Personal history of other diseases of the female genital tract: Secondary | ICD-10-CM | POA: Diagnosis not present

## 2016-05-09 DIAGNOSIS — Z01419 Encounter for gynecological examination (general) (routine) without abnormal findings: Secondary | ICD-10-CM

## 2016-05-09 DIAGNOSIS — E559 Vitamin D deficiency, unspecified: Secondary | ICD-10-CM

## 2016-05-09 DIAGNOSIS — Z0000 Encounter for general adult medical examination without abnormal findings: Secondary | ICD-10-CM | POA: Diagnosis not present

## 2016-05-09 LAB — CBC
HCT: 40.6 % (ref 35.0–45.0)
HEMOGLOBIN: 13.5 g/dL (ref 11.7–15.5)
MCH: 32.8 pg (ref 27.0–33.0)
MCHC: 33.3 g/dL (ref 32.0–36.0)
MCV: 98.5 fL (ref 80.0–100.0)
MPV: 10.3 fL (ref 7.5–12.5)
Platelets: 310 10*3/uL (ref 140–400)
RBC: 4.12 MIL/uL (ref 3.80–5.10)
RDW: 12.7 % (ref 11.0–15.0)
WBC: 5.8 10*3/uL (ref 3.8–10.8)

## 2016-05-09 NOTE — Patient Instructions (Signed)

## 2016-05-09 NOTE — Progress Notes (Deleted)
45 y.o. [id number] Married{Race/ethnicity:17218}F here for annual exam.      No LMP recorded.          Sexually active: {yes no:314532}  The current method of family planning is {contraception:315051}.    Exercising: {yes no:314532}  {types:19826} Smoker:  {YES [id number]:22349}  Health Maintenance: Pap:  04/14/14 Neg. HR HPV:neg History of abnormal Pap:  Yes, LSIL  MMG:  11/29/15 BIRADS1:neg Colonoscopy:  Never BMD:   Never TDaP:  07/2009    reports that she has never smoked. She has never used smokeless tobacco. She reports that she drinks alcohol. She reports that she does not use illicit drugs.  Past Medical History  Diagnosis Date  . Pulmonary stenosis   . Heart murmur   . 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 Congenital pulmonary stenosis   . Migraine without aura     Past Surgical History  Procedure Laterality Date  . Cardiac catheterization  1975    Current Outpatient Prescriptions  Medication Sig Dispense Refill  . IBUPROFEN PO Take by mouth.    . levocetirizine (XYZAL) 2.5 MG/5ML solution Take 5 mLs (2.5 mg total) by mouth every evening. 148 mL   . SUMAtriptan (IMITREX) 100 MG tablet TAKE 1 TABLET BY MOUTH AT ONSET OF HEADACHE 27 tablet 0   No current facility-administered medications for this visit.    No family history on file.  Review of Systems  Exam:   There were no vitals taken for this visit.  Weight change: @WEIGHTCHANGE @ Height:      Ht Readings from Last 3 Encounters:  05/18/14 5' 4.75" (1.645 m)  04/14/14 5' 4.75" (1.645 m)  04/03/13 5' 4.5" (1.638 m)    General appearance: alert, cooperative and appears stated age Head: Normocephalic, without obvious abnormality, atraumatic Neck: no adenopathy, supple, symmetrical, trachea midline and thyroid {CHL AMB PHY EX THYROID NORM DEFAULT:[phone]::"normal to inspection and palpation"} Lungs: clear to auscultation bilaterally Breasts: {Exam; breast:13139::"normal appearance,  no masses or tenderness"} Heart: regular rate and rhythm Abdomen: soft, non-tender; bowel sounds normal; no masses,  no organomegaly Extremities: extremities normal, atraumatic, no cyanosis or edema Skin: Skin color, texture, turgor normal. No rashes or lesions Lymph nodes: Cervical, supraclavicular, and axillary nodes normal. No abnormal inguinal nodes palpated Neurologic: Grossly normal   Pelvic: External genitalia:  no lesions              Urethra:  normal appearing urethra with no masses, tenderness or lesions              Bartholins and Skenes: normal                 Vagina: normal appearing vagina with normal color and discharge, no lesions              Cervix: {CHL AMB PHY EX CERVIX NORM DEFAULT:[phone]::"no lesions"}               Bimanual Exam:  Uterus:  {CHL AMB PHY EX UTERUS NORM DEFAULT:[phone]::"normal size, contour, position, consistency, mobility, non-tender"}              Adnexa: {CHL AMB PHY EX ADNEXA NO MASS DEFAULT:[phone]::"no mass, fullness, tenderness"}               Rectovaginal: Confirms               Anus:  normal sphincter tone, no lesions  Chaperone was present for exam.  A:  Well Woman with normal exam  P:

## 2016-05-09 NOTE — Progress Notes (Signed)
45 y.o. [id number] MarriedCaucasianF here for annual exam.  The patient has a h/o a complex left ovarian cyst 2 years ago, she never came for f/u. Discussed again the need for f/u.  Menses q 1-2 months x  4 days, occasionally bleeds for 7 days. She can saturate a super tampon in up to 4 hours. No BTB. Cramps are tolerable with ibuprofen.  Sexually active, no pain.  Migraines have improved, needing less imitrex (25-50 mg). Migraines are not cyclic. Stress is the biggest trigger.  Period Cycle (Days): 28 Period Duration (Days): 4 days  Period Pattern: (!) Irregular Menstrual Flow: Moderate Menstrual Control: Tampon Menstrual Control Change Freq (Hours): every 1- 4 hours  Dysmenorrhea: (!) Moderate Dysmenorrhea Symptoms: Cramping, Headache, Diarrhea, Other (Comment) (Bloating )  Patient's last menstrual period was 05/02/2016.          Sexually active: Yes.    The current method of family planning is vasectomy.    Exercising: Yes.    Running, LexicographerCross Training Smoker:  no  Health Maintenance: Pap:  04/14/14 Neg. HR HPV:neg History of abnormal Pap:  Yes, LGSIL, CIN I, several years ago, at least 2 normal paps MMG:  11/29/15 BIRADS1:neg Colonoscopy:  Never BMD:   Never TDaP:  07/2009    reports that she has never smoked. She has never used smokeless tobacco. She reports that she drinks alcohol. She reports that she does not use illicit drugs. Kids are 19 and 17. Oldest is a boy, youngest is a girl. Oldest is at [employer], daughter is going to be a Holiday representativesenior. Husband is an Investment banker, operationalrthopedic Surgeon. 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 She travels with work, 8/12 weeks in the winter. She designs offices, medical.   Past Medical History  Diagnosis Date  . Pulmonary stenosis   . Heart murmur   . 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 Congenital pulmonary stenosis   . Migraine without aura     Past Surgical History  Procedure Laterality Date  . Cardiac  catheterization  1975    Current Outpatient Prescriptions  Medication Sig Dispense Refill  . IBUPROFEN PO Take by mouth.    . levocetirizine (XYZAL) 2.5 MG/5ML solution Take 5 mLs (2.5 mg total) by mouth every evening. 148 mL   . SUMAtriptan (IMITREX) 100 MG tablet TAKE 1 TABLET BY MOUTH AT ONSET OF HEADACHE 27 tablet 0   No current facility-administered medications for this visit.    History reviewed. No pertinent family history.  Review of Systems  Constitutional: Negative.   HENT: Negative.   Eyes: Negative.   Respiratory: Negative.   Cardiovascular: Negative.   Gastrointestinal: Negative.   Endocrine: Negative.   Genitourinary: Negative.   Musculoskeletal: Negative.   Skin: Negative.   Allergic/Immunologic: Negative.   Neurological: Negative.   Hematological: Negative.   Psychiatric/Behavioral: Negative.     Exam:   BP 94/70 mmHg  Pulse 56  Resp 16  Ht 5\' 4"  (1.626 m)  Wt 135 lb (61.236 kg)  BMI 23.16 kg/m2  LMP 05/02/2016  Weight change: @WEIGHTCHANGE @ Height:   Height: 5\' 4"  (162.6 cm)  Ht Readings from Last 3 Encounters:  05/09/16 5\' 4"  (1.626 m)  05/18/14 5' 4.75" (1.645 m)  04/14/14 5' 4.75" (1.645 m)    General appearance: alert, cooperative and appears stated age Head: Normocephalic, without obvious abnormality, atraumatic Neck: no adenopathy, supple, symmetrical, trachea midline and thyroid normal to inspection and palpation Lungs: clear  to auscultation bilaterally Breasts: normal appearance, no masses or tenderness Heart: regular rate and rhythm Abdomen: soft, non-tender; bowel sounds normal; no masses,  no organomegaly Extremities: extremities normal, atraumatic, no cyanosis or edema Skin: Skin color, texture, turgor normal. No rashes or lesions Lymph nodes: Cervical, supraclavicular, and axillary nodes normal. No abnormal inguinal nodes palpated Neurologic: Grossly normal   Pelvic: External genitalia:  no lesions              Urethra:  normal  appearing urethra with no masses, tenderness or lesions              Bartholins and Skenes: normal                 Vagina: normal appearing vagina with normal color and discharge, no lesions              Cervix: no lesions               Bimanual Exam:  Uterus:  normal size, contour, position, consistency, mobility, non-tender and anteverted              Adnexa: no mass, fullness, tenderness               Rectovaginal: Confirms               Anus:  normal sphincter tone, no lesions  Chaperone was present for exam.  A:  Well Woman with normal exam    P:   No pap this year  Discussed breast self exam  Discussed calcium and vit D intake  Mammogram UTD  Return for an ultrasound  CBC, CMP, vit D

## 2016-05-10 ENCOUNTER — Telehealth: Payer: Self-pay | Admitting: Obstetrics and Gynecology

## 2016-05-10 LAB — VITAMIN D 25 HYDROXY (VIT D DEFICIENCY, FRACTURES): Vit D, 25-Hydroxy: 28 ng/mL — ABNORMAL LOW (ref 30–100)

## 2016-05-10 LAB — COMPREHENSIVE METABOLIC PANEL
ALBUMIN: 4.5 g/dL (ref 3.6–5.1)
ALT: 12 U/L (ref 6–29)
AST: 18 U/L (ref 10–35)
Alkaline Phosphatase: 69 U/L (ref 33–115)
BILIRUBIN TOTAL: 1.4 mg/dL — AB (ref 0.2–1.2)
BUN: 15 mg/dL (ref 7–25)
CHLORIDE: 103 mmol/L (ref 98–110)
CO2: 24 mmol/L (ref 20–31)
Calcium: 9.2 mg/dL (ref 8.6–10.2)
Creat: 0.78 mg/dL (ref 0.50–1.10)
GLUCOSE: 82 mg/dL (ref 65–99)
Potassium: 4 mmol/L (ref 3.5–5.3)
Sodium: 138 mmol/L (ref 135–146)
Total Protein: 7.3 g/dL (ref 6.1–8.1)

## 2016-05-10 NOTE — Telephone Encounter (Signed)
Spoke with patient regarding benefit for ultrasound. Patient understood and agreeable. Patient is scheduled 06/05/16 with Dr Oscar LaJertson. Patient declined earlier appointment dates due to being out of town and scheduling conflicts. Patient is aware of arrival date and time. Pt aware of 72 hours cancellation policy.

## 2016-05-11 LAB — BILIRUBIN, FRACTIONATED(TOT/DIR/INDIR)
Bilirubin, Direct: 0.2 mg/dL (ref <=0.2)
Indirect Bilirubin: 0.9 mg/dL (ref 0.2–1.2)
Total Bilirubin: 1.1 mg/dL (ref 0.2–1.2)

## 2016-05-14 ENCOUNTER — Telehealth: Payer: Self-pay | Admitting: *Deleted

## 2016-05-14 NOTE — Telephone Encounter (Signed)
Left patient a message to call -eh

## 2016-05-14 NOTE — Telephone Encounter (Signed)
-----   Message from Romualdo Bolk, MD sent at 05/13/2016  4:56 PM EDT ----- F/U blood work was normal. Nothing to worry about

## 2016-05-14 NOTE — Telephone Encounter (Signed)
Patient called back- she is aware of results -eh

## 2016-05-31 NOTE — Telephone Encounter (Signed)
Patient called and cancelled her ultrasound for 06/05/16. Please call her back to reschedule.

## 2016-05-31 NOTE — Telephone Encounter (Signed)
Returned call to patient to rescheduled ultrasound appointment. Left message on voicemail requesting a return call.

## 2016-06-05 ENCOUNTER — Other Ambulatory Visit: Payer: Managed Care, Other (non HMO)

## 2016-06-05 ENCOUNTER — Other Ambulatory Visit: Payer: Managed Care, Other (non HMO) | Admitting: Obstetrics and Gynecology

## 2016-06-07 NOTE — Telephone Encounter (Signed)
Spoke with patient. Patient would like to reschedule her PUS at this time. PUS is for follow up for history of complex ovarian cyst. Patient reports she is unable to be seen until October due to her work schedule. PUS rescheduled for 08/02/2016 at 9 am with 9:30 am consult with Dr.Jertson. She is agreeable to date and time.  Routing to provider for final review. Patient agreeable to disposition. Will close encounter.

## 2016-08-02 ENCOUNTER — Ambulatory Visit (INDEPENDENT_AMBULATORY_CARE_PROVIDER_SITE_OTHER): Payer: Managed Care, Other (non HMO) | Admitting: Obstetrics and Gynecology

## 2016-08-02 ENCOUNTER — Other Ambulatory Visit (INDEPENDENT_AMBULATORY_CARE_PROVIDER_SITE_OTHER): Payer: Managed Care, Other (non HMO)

## 2016-08-02 ENCOUNTER — Encounter: Payer: Self-pay | Admitting: Obstetrics and Gynecology

## 2016-08-02 VITALS — BP 98/58 | HR 76 | Resp 16 | Wt 136.0 lb

## 2016-08-02 DIAGNOSIS — Z8742 Personal history of other diseases of the female genital tract: Secondary | ICD-10-CM | POA: Diagnosis not present

## 2016-08-02 DIAGNOSIS — N83209 Unspecified ovarian cyst, unspecified side: Secondary | ICD-10-CM

## 2016-08-02 NOTE — Progress Notes (Signed)
GYNECOLOGY  VISIT   HPI: 45 y.o.   Married  Caucasian  female   G2P2002 with Patient's last menstrual period was 06/25/2016.   here for follow up complex cyst on left ovary 2 years ago   GYNECOLOGIC HISTORY: Patient's last menstrual period was 06/25/2016. Contraception:None Menopausal hormone therapy: none         OB History    Gravida Para Term Preterm AB Living   2 2 2     2    SAB TAB Ectopic Multiple Live Births                     Patient Active Problem List   Diagnosis Date Noted  . Migraine without aura   . Congenital pulmonary stenosis   . Pulmonary stenosis   . Heart murmur     Past Medical History:  Diagnosis Date  . Congenital pulmonary stenosis   . Dysplasia of cervix, low grade (CIN 1) 1/10   f/u paps ASCUS - HPV  . Heart murmur   . Migraine without aura   . Pulmonary stenosis     Past Surgical History:  Procedure Laterality Date  . CARDIAC CATHETERIZATION  1975    Current Outpatient Prescriptions  Medication Sig Dispense Refill  . IBUPROFEN PO Take by mouth.    . levocetirizine (XYZAL) 2.5 MG/5ML solution Take 5 mLs (2.5 mg total) by mouth every evening. 148 mL   . SUMAtriptan (IMITREX) 100 MG tablet TAKE 1 TABLET BY MOUTH AT ONSET OF HEADACHE 27 tablet 0   No current facility-administered medications for this visit.      ALLERGIES: Codeine  No family history on file.  Social History   Social History  . Marital status: Married    Spouse name: N/A  . Number of children: 2  . Years of education: N/A   Occupational History  . interior design    Social History Main Topics  . Smoking status: Never Smoker  . Smokeless tobacco: Never Used  . Alcohol use 0.0 oz/week     Comment: OCCASSIONALLY  . Drug use: No  . Sexual activity: Yes    Birth control/ protection: Surgical     Comment: vasectomy   Other Topics Concern  . Not on file   Social History Narrative   Husband is an Investment banker, operationalorthopedic surgeon with [employer]    Review of Systems   Constitutional: Negative.   HENT: Negative.   Eyes: Negative.   Respiratory: Negative.   Cardiovascular: Negative.   Gastrointestinal: Negative.   Genitourinary: Negative.   Musculoskeletal: Negative.   Skin: Negative.   Neurological: Negative.   Endo/Heme/Allergies: Negative.   Psychiatric/Behavioral: Negative.     PHYSICAL EXAMINATION:    BP (!) 98/58 (BP Location: Right Arm, Patient Position: Sitting, Cuff Size: Normal)   Pulse 76   Resp 16   Wt 136 lb (61.7 kg)   LMP 06/25/2016   BMI 23.34 kg/m     General appearance: alert, cooperative and appears stated age  ASSESSMENT H/O complex ovarian cyst   PLAN Ultrasound with resolution of her cyst Images reviewed   An After Visit Summary was printed and given to the patient.

## 2017-04-15 ENCOUNTER — Telehealth: Payer: Self-pay | Admitting: Obstetrics and Gynecology

## 2017-04-15 ENCOUNTER — Ambulatory Visit (INDEPENDENT_AMBULATORY_CARE_PROVIDER_SITE_OTHER): Payer: 59 | Admitting: Obstetrics and Gynecology

## 2017-04-15 ENCOUNTER — Other Ambulatory Visit: Payer: Self-pay | Admitting: Obstetrics and Gynecology

## 2017-04-15 ENCOUNTER — Encounter: Payer: Self-pay | Admitting: Obstetrics and Gynecology

## 2017-04-15 VITALS — BP 100/60 | HR 64 | Resp 14 | Wt 139.0 lb

## 2017-04-15 DIAGNOSIS — N644 Mastodynia: Secondary | ICD-10-CM

## 2017-04-15 DIAGNOSIS — N926 Irregular menstruation, unspecified: Secondary | ICD-10-CM | POA: Diagnosis not present

## 2017-04-15 DIAGNOSIS — Z1231 Encounter for screening mammogram for malignant neoplasm of breast: Secondary | ICD-10-CM

## 2017-04-15 MED ORDER — MEDROXYPROGESTERONE ACETATE 5 MG PO TABS: ORAL_TABLET | ORAL | 1 refills | Status: DC

## 2017-04-15 NOTE — Progress Notes (Signed)
GYNECOLOGY  VISIT   HPI: 46 y.o.   Married  Caucasian  female   G2P2002 with Patient's last menstrual period was 01/24/2017.   here c/o right breast pain and mass   Last week she had some sensitivity in her right breast, localized in one area. She feels a little lump in that spot. Feels a little better this week.  Cycles have been irregular for the last few years, can have a cycle monthly for a while, then skip up to 3 months at a time. Typically lasts for 4 days, after skipping a cycle can bleed for 8-10 days. Never very heavy.  She is having hot flashes and night sweats.  GYNECOLOGIC HISTORY: Patient's last menstrual period was 01/24/2017. Contraception:vasectomy  Menopausal hormone therapy: none         OB History    Gravida Para Term Preterm AB Living   2 2 2     2    SAB TAB Ectopic Multiple Live Births                     Patient Active Problem List   Diagnosis Date Noted  . Migraine without aura   . Congenital pulmonary stenosis   . Pulmonary stenosis   . Heart murmur     Past Medical History:  Diagnosis Date  . Congenital pulmonary stenosis   . Dysplasia of cervix, low grade (CIN 1) 1/10   f/u paps ASCUS - HPV  . Heart murmur   . Migraine without aura   . Pulmonary stenosis     Past Surgical History:  Procedure Laterality Date  . CARDIAC CATHETERIZATION  1975    Current Outpatient Prescriptions  Medication Sig Dispense Refill  . IBUPROFEN PO Take by mouth.    . levocetirizine (XYZAL) 2.5 MG/5ML solution Take 5 mLs (2.5 mg total) by mouth every evening. 148 mL   . SUMAtriptan (IMITREX) 100 MG tablet TAKE 1 TABLET BY MOUTH AT ONSET OF HEADACHE 27 tablet 0   No current facility-administered medications for this visit.      ALLERGIES: Codeine  No family history on file.  Social History   Social History  . Marital status: Married    Spouse name: N/A  . Number of children: 2  . Years of education: N/A   Occupational History  . interior design     Social History Main Topics  . Smoking status: Never Smoker  . Smokeless tobacco: Never Used  . Alcohol use 0.0 oz/week     Comment: OCCASSIONALLY  . Drug use: No  . Sexual activity: Yes    Birth control/ protection: Surgical     Comment: vasectomy   Other Topics Concern  . Not on file   Social History Narrative   Husband is an Investment banker, operational with [employer]    Review of Systems  Constitutional: Negative.   HENT: Negative.   Eyes: Negative.   Respiratory: Negative.   Cardiovascular: Negative.   Gastrointestinal: Negative.   Genitourinary:       Right breast pain and mass  Musculoskeletal: Negative.   Skin: Negative.   Neurological: Negative.   Endo/Heme/Allergies: Negative.   Psychiatric/Behavioral: Negative.     PHYSICAL EXAMINATION:    BP 100/60 (BP Location: Right Arm, Patient Position: Sitting, Cuff Size: Normal)   Pulse 64   Resp 14   Wt 139 lb (63 kg)   LMP 01/24/2017   BMI 23.86 kg/m     General appearance: alert, cooperative and appears stated  age Breasts: normal appearance, no masses or tenderness  No axillary or supraclavicular adenopathy   ASSESSMENT Breast lump, resolved, neither I or the patient can find a lump today Mastalgia resolved Irregular cycles, she states this has been happening for years, she does have vasomotor symptoms (suspect perimenopausal)     PLAN She will proceed with screening mammogram, with 3D Will treat with cycle provera She is returning for her annual next month will add TSH to routine labs   An After Visit Summary was printed and given to the patient.  15 minutes face to face time of which over 50% was spent in counseling.

## 2017-04-15 NOTE — Telephone Encounter (Signed)
Patient found a nodule in her right breast.

## 2017-04-15 NOTE — Telephone Encounter (Signed)
Spoke with patient. Patient states that she found a lump in her right breast last week. Lump is tender. Denies any redness or swelling to the breast. Advised she will need to be seen for evaluation. Patient is agreeable. Appointment scheduled for 04/15/2017 at 10 am with Dr.Jertson. Patient is agreeable to date and time.  Routing to provider for final review. Patient agreeable to disposition. Will close encounter.

## 2017-05-06 ENCOUNTER — Ambulatory Visit
Admission: RE | Admit: 2017-05-06 | Discharge: 2017-05-06 | Disposition: A | Discharge status: Home / Self Care | Payer: 59 | Source: Ambulatory Visit | Attending: Obstetrics and Gynecology | Admitting: Obstetrics and Gynecology

## 2017-05-06 DIAGNOSIS — Z1231 Encounter for screening mammogram for malignant neoplasm of breast: Secondary | ICD-10-CM

## 2017-05-07 ENCOUNTER — Other Ambulatory Visit: Payer: Self-pay | Admitting: Obstetrics and Gynecology

## 2017-05-07 DIAGNOSIS — R928 Other abnormal and inconclusive findings on diagnostic imaging of breast: Secondary | ICD-10-CM

## 2017-05-10 ENCOUNTER — Ambulatory Visit
Admission: RE | Admit: 2017-05-10 | Discharge: 2017-05-10 | Disposition: A | Discharge status: Home / Self Care | Payer: 59 | Source: Ambulatory Visit | Attending: Obstetrics and Gynecology | Admitting: Obstetrics and Gynecology

## 2017-05-10 DIAGNOSIS — R928 Other abnormal and inconclusive findings on diagnostic imaging of breast: Secondary | ICD-10-CM

## 2017-05-13 ENCOUNTER — Other Ambulatory Visit (HOSPITAL_COMMUNITY)
Admission: RE | Admit: 2017-05-13 | Discharge: 2017-05-13 | Disposition: A | Discharge status: Home / Self Care | Payer: 59 | Source: Ambulatory Visit | Attending: Obstetrics and Gynecology | Admitting: Obstetrics and Gynecology

## 2017-05-13 ENCOUNTER — Encounter: Payer: Self-pay | Admitting: Obstetrics and Gynecology

## 2017-05-13 ENCOUNTER — Ambulatory Visit (INDEPENDENT_AMBULATORY_CARE_PROVIDER_SITE_OTHER): Payer: 59 | Admitting: Obstetrics and Gynecology

## 2017-05-13 VITALS — BP 98/58 | HR 64 | Resp 14 | Ht 64.25 in | Wt 141.0 lb

## 2017-05-13 DIAGNOSIS — Z23 Encounter for immunization: Secondary | ICD-10-CM

## 2017-05-13 DIAGNOSIS — Z01419 Encounter for gynecological examination (general) (routine) without abnormal findings: Secondary | ICD-10-CM | POA: Diagnosis not present

## 2017-05-13 DIAGNOSIS — Z0000 Encounter for general adult medical examination without abnormal findings: Secondary | ICD-10-CM | POA: Insufficient documentation

## 2017-05-13 DIAGNOSIS — N926 Irregular menstruation, unspecified: Secondary | ICD-10-CM | POA: Diagnosis not present

## 2017-05-13 DIAGNOSIS — N951 Menopausal and female climacteric states: Secondary | ICD-10-CM

## 2017-05-13 DIAGNOSIS — Z124 Encounter for screening for malignant neoplasm of cervix: Secondary | ICD-10-CM

## 2017-05-13 NOTE — Patient Instructions (Signed)

## 2017-05-13 NOTE — Progress Notes (Signed)
46 y.o. [id number] MarriedCaucasianF here for annual exam.  Her cycles are spacing out. Every one to 3 months. She just took provera and is having a w/d bleed. Current cycle is light. If she skipped multiple months cycles were lasting 7-8 days. Typically 4 days.  She has had vasomotor symptoms mostly at night, currently better. Breast pain has improved. No dyspareunia.  Period Duration (Days): 2 days  Period Pattern: (!) Irregular Menstrual Flow: Light Menstrual Control: Tampon Dysmenorrhea: (!) Mild Dysmenorrhea Symptoms: Cramping  Patient's last menstrual period was 05/11/2017.          Sexually active: Yes.    The current method of family planning is vasectomy.    Exercising: Yes.    cross fit Smoker:  no  Health Maintenance: Pap:  04-14-14 WNL NEG HR HPV  History of abnormal Pap: yes years ago  MMG:  05-10-17 breast U/S normal  Colonoscopy:  Never BMD:   Never TDaP:  2009 Gardasil: N/A   reports that she has never smoked. She has never used smokeless tobacco. She reports that she drinks about 3.6 oz of alcohol per week . She reports that she does not use drugs. Son is 42 and Daughter is 67. Oldest is at [employer], daughter is going to be a Holiday representative. Husband is an Investment banker, operational. 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 She designs offices, medical.   Past Medical History:  Diagnosis Date  . Congenital pulmonary stenosis   . Dysplasia of cervix, low grade (CIN 1) 1/10   f/u paps ASCUS - HPV  . Heart murmur   . Migraine without aura   . Pulmonary stenosis   Migraines are improving.   Past Surgical History:  Procedure Laterality Date  . CARDIAC CATHETERIZATION  1975    Current Outpatient Prescriptions  Medication Sig Dispense Refill  . IBUPROFEN PO Take by mouth.    . levocetirizine (XYZAL) 2.5 MG/5ML solution Take 5 mLs (2.5 mg total) by mouth every evening. 148 mL   . medroxyPROGESTERone (PROVERA) 5 MG tablet 1 tablet a  day for 5 days every other month if no spontaneous menses. 15 tablet 1  . SUMAtriptan (IMITREX) 100 MG tablet TAKE 1 TABLET BY MOUTH AT ONSET OF HEADACHE 27 tablet 0   No current facility-administered medications for this visit.     No family history on file.  Review of Systems  Constitutional: Negative.   HENT: Negative.   Eyes: Negative.   Respiratory: Negative.   Cardiovascular: Negative.   Gastrointestinal: Negative.   Endocrine: Negative.   Genitourinary: Positive for menstrual problem.  Musculoskeletal: Negative.   Skin: Negative.   Allergic/Immunologic: Negative.   Neurological: Negative.   Psychiatric/Behavioral: Negative.     Exam:   BP (!) 98/58 (BP Location: Right Arm, Patient Position: Sitting, Cuff Size: Normal)   Pulse 64   Resp 14   Ht 5' 4.25" (1.632 m)   Wt 141 lb (64 kg)   LMP 05/11/2017   BMI 24.01 kg/m   Weight change: @WEIGHTCHANGE @ Height:   Height: 5' 4.25" (163.2 cm)  Ht Readings from Last 3 Encounters:  05/13/17 5' 4.25" (1.632 m)  05/09/16 5\' 4"  (1.626 m)  05/18/14 5' 4.75" (1.645 m)    General appearance: alert, cooperative and appears stated age Head: Normocephalic, without obvious abnormality, atraumatic Neck: no adenopathy, supple, symmetrical, trachea midline and thyroid normal to inspection and palpation Lungs: clear to auscultation bilaterally Cardiovascular: regular rate and rhythm  Breasts: normal appearance, no masses or tenderness Abdomen: soft, non-tender; bowel sounds normal; no masses,  no organomegaly Extremities: extremities normal, atraumatic, no cyanosis or edema Skin: Skin color, texture, turgor normal. No rashes or lesions Lymph nodes: Cervical, supraclavicular, and axillary nodes normal. No abnormal inguinal nodes palpated Neurologic: Grossly normal   Pelvic: External genitalia:  no lesions              Urethra:  normal appearing urethra with no masses, tenderness or lesions              Bartholins and Skenes: normal                  Vagina: normal appearing vagina with normal color and discharge, no lesions              Cervix: no lesions               Bimanual Exam:  Uterus:  normal size, contour, position, consistency, mobility, non-tender and retroverted              Adnexa: no mass, fullness, tenderness               Rectovaginal: Confirms               Anus:  normal sphincter tone, no lesions  Chaperone was present for exam.  A:  Well Woman with normal exam  Irregular cycles, c/w perimenopause    P:   Mammogram UTD  TDAP  Pap with hpv  Labs with TSH and prolactin  Discussed breast self awareness  Discussed calcium and vit D intake

## 2017-05-14 ENCOUNTER — Telehealth: Payer: Self-pay | Admitting: *Deleted

## 2017-05-14 DIAGNOSIS — R7989 Other specified abnormal findings of blood chemistry: Secondary | ICD-10-CM

## 2017-05-14 LAB — LIPID PANEL
CHOLESTEROL TOTAL: 177 mg/dL (ref 100–199)
Chol/HDL Ratio: 2.5 ratio (ref 0.0–4.4)
HDL: 72 mg/dL (ref >39)
LDL Calculated: 77 mg/dL (ref 0–99)
TRIGLYCERIDES: 142 mg/dL (ref 0–149)
VLDL Cholesterol Cal: 28 mg/dL (ref 5–40)

## 2017-05-14 LAB — COMPREHENSIVE METABOLIC PANEL
A/G RATIO: 1.7 (ref 1.2–2.2)
ALK PHOS: 75 IU/L (ref 39–117)
ALT: 14 IU/L (ref 0–32)
AST: 20 IU/L (ref 0–40)
Albumin: 4.3 g/dL (ref 3.5–5.5)
BILIRUBIN TOTAL: 0.8 mg/dL (ref 0.0–1.2)
BUN/Creatinine Ratio: 19 (ref 9–23)
BUN: 13 mg/dL (ref 6–24)
CO2: 23 mmol/L (ref 20–29)
Calcium: 8.7 mg/dL (ref 8.7–10.2)
Chloride: 105 mmol/L (ref 96–106)
Creatinine, Ser: 0.7 mg/dL (ref 0.57–1.00)
GFR calc Af Amer: 118 mL/min/{1.73_m2} (ref >59)
GFR calc non Af Amer: 103 mL/min/{1.73_m2} (ref >59)
GLOBULIN, TOTAL: 2.6 g/dL (ref 1.5–4.5)
Glucose: 89 mg/dL (ref 65–99)
POTASSIUM: 4 mmol/L (ref 3.5–5.2)
SODIUM: 141 mmol/L (ref 134–144)
Total Protein: 6.9 g/dL (ref 6.0–8.5)

## 2017-05-14 LAB — CBC
HEMATOCRIT: 39.7 % (ref 34.0–46.6)
Hemoglobin: 12.7 g/dL (ref 11.1–15.9)
MCH: 32.5 pg (ref 26.6–33.0)
MCHC: 32 g/dL (ref 31.5–35.7)
MCV: 102 fL — AB (ref 79–97)
PLATELETS: 291 10*3/uL (ref 150–379)
RBC: 3.91 x10E6/uL (ref 3.77–5.28)
RDW: 13.3 % (ref 12.3–15.4)
WBC: 7.8 10*3/uL (ref 3.4–10.8)

## 2017-05-14 LAB — TSH: TSH: 7.05 u[IU]/mL — ABNORMAL HIGH (ref 0.450–4.500)

## 2017-05-14 LAB — PROLACTIN: Prolactin: 21.7 ng/mL (ref 4.8–23.3)

## 2017-05-14 NOTE — Telephone Encounter (Signed)
-----   Message from Romualdo BolkJill Evelyn Jertson, MD sent at 05/14/2017  7:33 AM EDT ----- Please add FT3 and FT4 onto her blood work from yesterday. Let the patient know that her TSH is elevated and that we are adding more lab work. The rest of her lab work is normal. Her pap is pending

## 2017-05-15 LAB — CYTOLOGY - PAP
DIAGNOSIS: NEGATIVE
HPV (WINDOPATH): NOT DETECTED

## 2017-05-16 ENCOUNTER — Telehealth: Payer: Self-pay | Admitting: *Deleted

## 2017-05-16 NOTE — Telephone Encounter (Signed)
Spoke with patient, advised of results and recommendations as seen below per Dr. Oscar LaJertson. Patient does not have PCP and declines assistance with scheduling. Patient states she will research PCP and return call. Advised patient can send copy of labs to provider once updated. Next AEX 05/19/18. Patient verbalizes understanding and is agreeable.  Patient is agreeable to disposition. Will close encounter.

## 2017-05-16 NOTE — Telephone Encounter (Signed)
-----   Message from Romualdo BolkJill Evelyn Jertson, MD sent at 05/15/2017  7:23 PM EDT ----- Please let the patient know that her FT4 and FT3 were normal. With her elevated TSH this goes along with subclinical hypothyroidism. She should f/u with a primary (or endocrinologist) for monitoring and to discuss possibility of treatment.  02 recall for her pap.

## 2017-05-16 NOTE — Telephone Encounter (Signed)
Left voice mail to call back 

## 2017-05-16 NOTE — Telephone Encounter (Signed)
Patient returning call.

## 2017-05-17 LAB — SPECIMEN STATUS REPORT

## 2017-05-17 LAB — T4, FREE: Free T4: 0.88 ng/dL (ref 0.82–1.77)

## 2017-05-17 LAB — T3, FREE: T3 FREE: 2.7 pg/mL (ref 2.0–4.4)

## 2017-05-27 ENCOUNTER — Other Ambulatory Visit: Payer: Self-pay | Admitting: Obstetrics and Gynecology

## 2017-05-28 NOTE — Telephone Encounter (Signed)
[facility] SUMATRIPTAN Last filled - 10/05/15, #27 X 0 RF Last AEX - 05/13/17 JJ Next AEX - 05/19/18 JJ Last MMG - 05/10/17, Bi-Rads 1:  Negative, repeat in one year  Please advise refills. Thank you.  Routing to Dr. Edward JollySilva in Dr. Salli QuarryJertson's absence.

## 2018-02-17 ENCOUNTER — Other Ambulatory Visit: Payer: Self-pay | Admitting: Obstetrics and Gynecology

## 2018-02-17 NOTE — Telephone Encounter (Signed)
Medication refill request: Imitrex  Last AEX:  05-13-17  Next AEX: 05-29-18  Last MMG (if hormonal medication request): 05-10-17 U/S WNL  Refill authorized: please advise

## 2018-05-19 ENCOUNTER — Ambulatory Visit: Payer: 59 | Admitting: Obstetrics and Gynecology

## 2018-05-23 ENCOUNTER — Other Ambulatory Visit: Payer: Self-pay | Admitting: Obstetrics and Gynecology

## 2018-05-23 DIAGNOSIS — Z1231 Encounter for screening mammogram for malignant neoplasm of breast: Secondary | ICD-10-CM

## 2018-05-28 NOTE — Progress Notes (Signed)
47 y.o. [id number] MarriedCaucasianF here for annual exam.   Period Duration (Days): 4 days Period Pattern: (!) Irregular Menstrual Flow: Light Menstrual Control: Tampon Menstrual Control Change Freq (Hours): changes tampon every 4 hours Dysmenorrhea: None  She isn't taking the cyclic provera secondary to side effects. Cycles have been every 1-3 months.  She has mild vasomotor symptoms and vaginal dryness.  No dyspareunia, some dryness, hasn't needed lubrication yet. .   Patient's last menstrual period was 03/09/2018 (exact date).          Sexually active: Yes.    The current method of family planning is vasectomy.    Exercising: Yes.    Crossfit Smoker:  no  Health Maintenance: Pap:  05/13/2017 pap neg, HR HPV neg, 04-14-14 WNL NEG HR HPV  History of abnormal Pap: yes years ago  MMG:  05-10-17 breast U/S Birads 1 negative, screening scheduled for 06/19/2018 Colonoscopy:  Never BMD:   Never TDaP:  05/13/2017 Gardasil: N/A   reports that she has never smoked. She has never used smokeless tobacco. She reports that she drinks about 6.0 standard drinks of alcohol per week. She reports that she does not use drugs. Son is 83 and Daughter is 42. Oldest is at [employer], daughter is at [employer]. Husband is an Investment banker, operational. 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 She designs offices, medical.   Past Medical History:  Diagnosis Date  . Congenital pulmonary stenosis   . Dysplasia of cervix, low grade (CIN 1) 1/10   f/u paps ASCUS - HPV  . Heart murmur   . Migraine without aura   . Pulmonary stenosis     Past Surgical History:  Procedure Laterality Date  . CARDIAC CATHETERIZATION  1975    Current Outpatient Medications  Medication Sig Dispense Refill  . IBUPROFEN PO Take by mouth.    . levocetirizine (XYZAL) 2.5 MG/5ML solution Take 5 mLs (2.5 mg total) by mouth every evening. 148 mL   . SUMAtriptan (IMITREX) 100 MG tablet TAKE 1 TABLET  AT ONSET OF HEADACHE 9 tablet 0  . medroxyPROGESTERone (PROVERA) 5 MG tablet 1 tablet a day for 5 days every other month if no spontaneous menses. (Patient not taking: Reported on 05/29/2018) 15 tablet 1   No current facility-administered medications for this visit.     History reviewed. No pertinent family history.  Review of Systems  Constitutional: Negative.   HENT: Negative.   Eyes: Negative.   Respiratory: Negative.   Cardiovascular: Negative.   Gastrointestinal: Negative.   Endocrine: Negative.   Genitourinary: Negative.   Musculoskeletal: Negative.   Skin: Negative.   Allergic/Immunologic: Negative.   Neurological: Negative.   Hematological: Negative.   Psychiatric/Behavioral: Negative.     Exam:   BP 108/74 (BP Location: Right Arm, Patient Position: Sitting)   Pulse 64   Ht 5\' 4"  (1.626 m)   Wt 137 lb 12.8 oz (62.5 kg)   LMP 03/09/2018 (Exact Date)   BMI 23.65 kg/m   Weight change: @WEIGHTCHANGE @ Height:   Height: 5\' 4"  (162.6 cm)  Ht Readings from Last 3 Encounters:  05/29/18 5\' 4"  (1.626 m)  05/13/17 5' 4.25" (1.632 m)  05/09/16 5\' 4"  (1.626 m)    General appearance: alert, cooperative and appears stated age Head: Normocephalic, without obvious abnormality, atraumatic Neck: no adenopathy, supple, symmetrical, trachea midline and thyroid normal to inspection and palpation Lungs: clear to auscultation bilaterally Cardiovascular: regular rate and rhythm Breasts: normal appearance,  no masses or tenderness Abdomen: soft, non-tender; non distended,  no masses,  no organomegaly Extremities: extremities normal, atraumatic, no cyanosis or edema Skin: Skin color, texture, turgor normal. No rashes or lesions Lymph nodes: Cervical, supraclavicular, and axillary nodes normal. No abnormal inguinal nodes palpated Neurologic: Grossly normal   Pelvic: External genitalia:  no lesions              Urethra:  normal appearing urethra with no masses, tenderness or lesions               Bartholins and Skenes: normal                 Vagina: normal appearing vagina with normal color and discharge, no lesions. Mild atrophy              Cervix: no lesions               Bimanual Exam:  Uterus:  normal size, contour, position, consistency, mobility, non-tender              Adnexa: no mass, fullness, tenderness               Rectovaginal: Confirms               Anus:  normal sphincter tone, no lesions  Chaperone was present for exam.  A:  Well Woman with normal exam  Elevated TSH last year, normal T3 and T4  Oligomenorrhea cw perimenopause, declines cyclic provera  Vit d def in diet  P:   No pap this year  CBC, CMP, vit D, TFT's (lipids great last year)  Mammogram scheduled  IFOB   Discussed breast self exam  Discussed calcium and vit D intake

## 2018-05-29 ENCOUNTER — Encounter: Payer: Self-pay | Admitting: Obstetrics and Gynecology

## 2018-05-29 ENCOUNTER — Other Ambulatory Visit: Payer: Self-pay

## 2018-05-29 ENCOUNTER — Ambulatory Visit (INDEPENDENT_AMBULATORY_CARE_PROVIDER_SITE_OTHER): Payer: Managed Care, Other (non HMO) | Admitting: Obstetrics and Gynecology

## 2018-05-29 VITALS — BP 108/74 | HR 64 | Ht 64.0 in | Wt 137.8 lb

## 2018-05-29 DIAGNOSIS — E559 Vitamin D deficiency, unspecified: Secondary | ICD-10-CM

## 2018-05-29 DIAGNOSIS — R7989 Other specified abnormal findings of blood chemistry: Secondary | ICD-10-CM

## 2018-05-29 DIAGNOSIS — Z1211 Encounter for screening for malignant neoplasm of colon: Secondary | ICD-10-CM | POA: Diagnosis not present

## 2018-05-29 DIAGNOSIS — Z01419 Encounter for gynecological examination (general) (routine) without abnormal findings: Secondary | ICD-10-CM

## 2018-05-29 DIAGNOSIS — Z0000 Encounter for general adult medical examination without abnormal findings: Secondary | ICD-10-CM | POA: Diagnosis not present

## 2018-05-29 NOTE — Patient Instructions (Signed)
EXERCISE AND DIET:  We recommended that you start or continue a regular exercise program for good health. Regular exercise means any activity that makes your heart beat faster and makes you sweat.  We recommend exercising at least 30 minutes per day at least 3 days a week, preferably 4 or 5.  We also recommend a diet low in fat and sugar.  Inactivity, poor dietary choices and obesity can cause diabetes, heart attack, stroke, and kidney damage, among others.    ALCOHOL AND SMOKING:  Women should limit their alcohol intake to no more than 7 drinks/beers/glasses of wine (combined, not each!) per week. Moderation of alcohol intake to this level decreases your risk of breast cancer and liver damage. And of course, no recreational drugs are part of a healthy lifestyle.  And absolutely no smoking or even second hand smoke. Most people know smoking can cause heart and lung diseases, but did you know it also contributes to weakening of your bones? Aging of your skin?  Yellowing of your teeth and nails?  CALCIUM AND VITAMIN D:  Adequate intake of calcium and Vitamin D are recommended.  The recommendations for exact amounts of these supplements seem to change often, but generally speaking 600 mg of calcium (either carbonate or citrate) and 800 units of Vitamin D per day seems prudent. Certain women may benefit from higher intake of Vitamin D.  If you are among these women, your doctor will have told you during your visit.    PAP SMEARS:  Pap smears, to check for cervical cancer or precancers,  have traditionally been done yearly, although recent scientific advances have shown that most women can have pap smears less often.  However, every woman still should have a physical exam from her gynecologist every 49 years old. It will include a breast check, inspection of the vulva and vagina to check for abnormal growths or skin changes, a visual exam of the cervix, and then an exam to evaluate the size and shape of the uterus and  ovaries.  And after 49 years of age, a rectal exam is indicated to check for rectal cancers. We will also provide age appropriate advice regarding health maintenance, like when you should have certain vaccines, screening for sexually transmitted diseases, bone density testing, colonoscopy, mammograms, etc.   MAMMOGRAMS:  All women over 49 years old should have a yearly mammogram. Many facilities now offer a "3D" mammogram, which may cost around $50 extra out of pocket. If possible,  we recommend you accept the option to have the 3D mammogram performed.  It both reduces the number of women who will be called back for extra views which then turn out to be normal, and it is better than the routine mammogram at detecting truly abnormal areas.    COLONOSCOPY:  Colonoscopy to screen for colon cancer is recommended for all women at age 49.  We know, you hate the idea of the prep.  We agree, BUT, having colon cancer and not knowing it is worse!!  Colon cancer so often starts as a polyp that can be seen and removed at colonscopy, which can quite literally save your life!  And if your first colonoscopy is normal and you have no family history of colon cancer, most women don't have to have it again for 10 years.  Once every ten years, you can do something that may end up saving your life, right?  We will be happy to help you get it scheduled when you are ready.    Be sure to check your insurance coverage so you understand how much it will cost.  It may be covered as a preventative service at no cost, but you should check your particular policy.      Breast Self-Awareness Breast self-awareness means being familiar with how your breasts look and feel. It involves checking your breasts regularly and reporting any changes to your health care provider. Practicing breast self-awareness is important. A change in your breasts can be a sign of a serious medical problem. Being familiar with how your breasts look and feel allows  you to find any problems early, when treatment is more likely to be successful. All women should practice breast self-awareness, including women who have had breast implants. How to do a breast self-exam One way to learn what is normal for your breasts and whether your breasts are changing is to do a breast self-exam. To do a breast self-exam: Look for Changes  1. Remove all the clothing above your waist. 2. Stand in front of a mirror in a room with good lighting. 3. Put your hands on your hips. 4. Push your hands firmly downward. 5. Compare your breasts in the mirror. Look for differences between them (asymmetry), such as: ? Differences in shape. ? Differences in size. ? Puckers, dips, and bumps in one breast and not the other. 6. Look at each breast for changes in your skin, such as: ? Redness. ? Scaly areas. 7. Look for changes in your nipples, such as: ? Discharge. ? Bleeding. ? Dimpling. ? Redness. ? A change in position. Feel for Changes  Carefully feel your breasts for lumps and changes. It is best to do this while lying on your back on the floor and again while sitting or standing in the shower or tub with soapy water on your skin. Feel each breast in the following way:  Place the arm on the side of the breast you are examining above your head.  Feel your breast with the other hand.  Start in the nipple area and make  inch (2 cm) overlapping circles to feel your breast. Use the pads of your three middle fingers to do this. Apply light pressure, then medium pressure, then firm pressure. The light pressure will allow you to feel the tissue closest to the skin. The medium pressure will allow you to feel the tissue that is a little deeper. The firm pressure will allow you to feel the tissue close to the ribs.  Continue the overlapping circles, moving downward over the breast until you feel your ribs below your breast.  Move one finger-width toward the center of the body.  Continue to use the  inch (2 cm) overlapping circles to feel your breast as you move slowly up toward your collarbone.  Continue the up and down exam using all three pressures until you reach your armpit.  Write Down What You Find  Write down what is normal for each breast and any changes that you find. Keep a written record with breast changes or normal findings for each breast. By writing this information down, you do not need to depend only on memory for size, tenderness, or location. Write down where you are in your menstrual cycle, if you are still menstruating. If you are having trouble noticing differences in your breasts, do not get discouraged. With time you will become more familiar with the variations in your breasts and more comfortable with the exam. How often should I examine my breasts? Examine   your breasts every month. If you are breastfeeding, the best time to examine your breasts is after a feeding or after using a breast pump. If you menstruate, the best time to examine your breasts is 5-7 days after your period is over. During your period, your breasts are lumpier, and it may be more difficult to notice changes. When should I see my health care provider? See your health care provider if you notice:  A change in shape or size of your breasts or nipples.  A change in the skin of your breast or nipples, such as a reddened or scaly area.  Unusual discharge from your nipples.  A lump or thick area that was not there before.  Pain in your breasts.  Anything that concerns you.  This information is not intended to replace advice given to you by your health care provider. Make sure you discuss any questions you have with your health care provider. Document Released: 10/08/2005 Document Revised: 03/15/2016 Document Reviewed: 08/28/2015 Elsevier Interactive Patient Education  2018 [employer] Inc[employer]  

## 2018-05-30 ENCOUNTER — Other Ambulatory Visit: Payer: Self-pay | Admitting: Obstetrics and Gynecology

## 2018-05-30 LAB — THYROID PANEL WITH TSH
Free Thyroxine Index: 1.9 (ref 1.2–4.9)
T3 Uptake Ratio: 26 % (ref 24–39)
T4, Total: 7.4 ug/dL (ref 4.5–12.0)
TSH: 4.48 u[IU]/mL (ref 0.450–4.500)

## 2018-05-30 LAB — COMPREHENSIVE METABOLIC PANEL
A/G RATIO: 2.3 — AB (ref 1.2–2.2)
ALBUMIN: 5.1 g/dL (ref 3.5–5.5)
ALT: 17 IU/L (ref 0–32)
AST: 19 IU/L (ref 0–40)
Alkaline Phosphatase: 77 IU/L (ref 39–117)
BUN / CREAT RATIO: 20 (ref 9–23)
BUN: 15 mg/dL (ref 6–24)
Bilirubin Total: 0.7 mg/dL (ref 0.0–1.2)
CALCIUM: 9.7 mg/dL (ref 8.7–10.2)
CO2: 24 mmol/L (ref 20–29)
CREATININE: 0.76 mg/dL (ref 0.57–1.00)
Chloride: 103 mmol/L (ref 96–106)
GFR, EST AFRICAN AMERICAN: 107 mL/min/{1.73_m2} (ref >59)
GFR, EST NON AFRICAN AMERICAN: 92 mL/min/{1.73_m2} (ref >59)
GLOBULIN, TOTAL: 2.2 g/dL (ref 1.5–4.5)
Glucose: 82 mg/dL (ref 65–99)
POTASSIUM: 4.2 mmol/L (ref 3.5–5.2)
SODIUM: 142 mmol/L (ref 134–144)
TOTAL PROTEIN: 7.3 g/dL (ref 6.0–8.5)

## 2018-05-30 LAB — VITAMIN D 25 HYDROXY (VIT D DEFICIENCY, FRACTURES): VIT D 25 HYDROXY: 30.2 ng/mL (ref 30.0–100.0)

## 2018-05-30 LAB — CBC
HEMATOCRIT: 40.3 % (ref 34.0–46.6)
HEMOGLOBIN: 13.2 g/dL (ref 11.1–15.9)
MCH: 32.6 pg (ref 26.6–33.0)
MCHC: 32.8 g/dL (ref 31.5–35.7)
MCV: 100 fL — AB (ref 79–97)
Platelets: 304 10*3/uL (ref 150–450)
RBC: 4.05 x10E6/uL (ref 3.77–5.28)
RDW: 12.6 % (ref 12.3–15.4)
WBC: 6.5 10*3/uL (ref 3.4–10.8)

## 2018-06-02 ENCOUNTER — Telehealth: Payer: Self-pay

## 2018-06-02 NOTE — Telephone Encounter (Signed)
Spoke with patient. Advised of results as seen below from Dr.Jertson. Patient verbalizes understanding. Encounter closed. 

## 2018-06-02 NOTE — Telephone Encounter (Signed)
Medication refill request: Imitrex Last AEX:  05/29/2018 Next AEX: 06/01/2019 Last MMG (if hormonal medication request): 05/06/2017 Refill authorized: #9, unsure how many refills to give, please advise

## 2018-06-02 NOTE — Telephone Encounter (Signed)
-----   Message from Romualdo BolkJill Evelyn Jertson, MD sent at 05/30/2018  1:50 PM EDT ----- Please inform the patient that her vit d is just in the normal range. I would recommend that she get an additional 400 IU of vit D 3 daily.  Her TSH is now in the high normal range (improved), other TFT's are normal. I would recheck this next year. The rest of her blood work is normal.

## 2018-06-19 ENCOUNTER — Ambulatory Visit
Admission: RE | Admit: 2018-06-19 | Discharge: 2018-06-19 | Disposition: A | Discharge status: Home / Self Care | Payer: Managed Care, Other (non HMO) | Source: Ambulatory Visit | Attending: Obstetrics and Gynecology | Admitting: Obstetrics and Gynecology

## 2018-06-19 DIAGNOSIS — Z1231 Encounter for screening mammogram for malignant neoplasm of breast: Secondary | ICD-10-CM

## 2018-07-01 ENCOUNTER — Telehealth: Payer: Self-pay

## 2018-07-01 LAB — FECAL OCCULT BLOOD, IMMUNOCHEMICAL: FECAL OCCULT BLD: NEGATIVE

## 2018-07-01 NOTE — Telephone Encounter (Signed)
-----   Message from Romualdo Bolk, MD sent at 07/01/2018 12:53 PM EDT ----- Please advise the patient of normal results.

## 2018-07-01 NOTE — Telephone Encounter (Signed)
Informed patient of results

## 2018-08-31 ENCOUNTER — Other Ambulatory Visit: Payer: Self-pay | Admitting: Obstetrics and Gynecology

## 2018-09-01 NOTE — Telephone Encounter (Signed)
Medication refill request: Imitrex Last AEX:  05/29/18 Next AEX: 06/01/19 Last MMG (if hormonal medication request):06/19/18 Bi-rads 1 Neg  Refill authorized: #9 with 1RF

## 2019-03-06 ENCOUNTER — Other Ambulatory Visit: Payer: Self-pay | Admitting: Obstetrics and Gynecology

## 2019-03-06 NOTE — Telephone Encounter (Signed)
Medication refill request: imitrex  Last AEX:  05/29/18 JJ Next AEX: 06/01/19 JJ Last MMG (if hormonal medication request): 06/19/18 [id number]:neg  Refill authorized: 09/01/18 #[id number]. Today please advise.

## 2019-05-12 ENCOUNTER — Other Ambulatory Visit: Payer: Self-pay | Admitting: Obstetrics and Gynecology

## 2019-05-12 DIAGNOSIS — Z1231 Encounter for screening mammogram for malignant neoplasm of breast: Secondary | ICD-10-CM

## 2019-06-01 ENCOUNTER — Ambulatory Visit: Payer: Managed Care, Other (non HMO) | Admitting: Obstetrics and Gynecology

## 2019-06-11 ENCOUNTER — Other Ambulatory Visit: Payer: Self-pay

## 2019-06-15 ENCOUNTER — Other Ambulatory Visit: Payer: Self-pay

## 2019-06-15 ENCOUNTER — Ambulatory Visit (INDEPENDENT_AMBULATORY_CARE_PROVIDER_SITE_OTHER): Payer: 59 | Admitting: Obstetrics and Gynecology

## 2019-06-15 ENCOUNTER — Encounter: Payer: Self-pay | Admitting: Obstetrics and Gynecology

## 2019-06-15 VITALS — BP 110/72 | HR 64 | Temp 97.1°F | Ht 64.0 in | Wt 134.6 lb

## 2019-06-15 DIAGNOSIS — R7989 Other specified abnormal findings of blood chemistry: Secondary | ICD-10-CM

## 2019-06-15 DIAGNOSIS — Z1211 Encounter for screening for malignant neoplasm of colon: Secondary | ICD-10-CM | POA: Diagnosis not present

## 2019-06-15 DIAGNOSIS — Z01419 Encounter for gynecological examination (general) (routine) without abnormal findings: Secondary | ICD-10-CM | POA: Diagnosis not present

## 2019-06-15 DIAGNOSIS — Z0000 Encounter for general adult medical examination without abnormal findings: Secondary | ICD-10-CM

## 2019-06-15 DIAGNOSIS — N950 Postmenopausal bleeding: Secondary | ICD-10-CM

## 2019-06-15 DIAGNOSIS — E559 Vitamin D deficiency, unspecified: Secondary | ICD-10-CM

## 2019-06-15 NOTE — Progress Notes (Signed)
48 y.o. G2P2002 Married White or Caucasian Not Hispanic or Latino female here for annual exam.   She had an episode of faint spotting in June, prior bleeding was in 5/19.  She is having some vaginal dryness, helped with lubrication.  She is having intermittent hot flashes, tolerable. No night sweats.     Patient's last menstrual period was 03/09/2018.          Sexually active: Yes.    The current method of family planning is spouse with vasectomy.    Exercising: Yes.    crossfit, running, walking Smoker:  no  Health Maintenance: Pap:05/13/2017 pap neg, HR HPV neg, 04-14-14 WNL NEG HR HPV History of abnormal Pap:yes years ago MMG:06/19/2018 Birads 1 negative, scheduled for 06/26/19.  Colonoscopy:Never, IFOB 06/25/2018 NEG DQQ:IWLNL TDaP:05/13/2017 Gardasil:N/A   reports that she has never smoked. She has never used smokeless tobacco. She reports current alcohol use of about 6.0 standard drinks of alcohol per week. She reports that she does not use drugs. Husband is an Doctor, general practice. She is an Community education officer, Forensic psychologist. Son is 18 Web designer at [employer]), daughter is 42 (at [employer]).  Past Medical History:  Diagnosis Date  . Congenital pulmonary stenosis   . Dysplasia of cervix, low grade (CIN 1) 1/10   f/u paps ASCUS - HPV  . Heart murmur   . Migraine without aura   . Pulmonary stenosis     Past Surgical History:  Procedure Laterality Date  . CARDIAC CATHETERIZATION  1975    Current Outpatient Medications  Medication Sig Dispense Refill  . IBUPROFEN PO Take by mouth.    . Levocetirizine Dihydrochloride (XYZAL ALLERGY 24HR PO) Take 1 tablet by mouth daily.    . Multiple Vitamin (MULTIVITAMIN PO) Take by mouth.    . Omega-3 Fatty Acids (FISH OIL PO) Take by mouth.    . SUMAtriptan (IMITREX) 100 MG tablet TAKE 1 TABLET AT ONSET OF HEADACHE 9 tablet 1  . VITAMIN D PO Take by mouth.     No current facility-administered medications for this visit.     Family  History  Problem Relation Age of Onset  . Breast cancer Neg Hx     Review of Systems  Constitutional: Negative.   HENT: Negative.   Eyes: Negative.   Respiratory: Negative.   Cardiovascular: Negative.   Gastrointestinal: Negative.   Endocrine: Negative.   Genitourinary: Negative.   Musculoskeletal: Negative.   Skin: Negative.   Allergic/Immunologic: Negative.   Neurological: Negative.   Hematological: Negative.   Psychiatric/Behavioral: Negative.     Exam:   BP 110/72 (BP Location: Right Arm, Patient Position: Sitting, Cuff Size: Normal)   Pulse 64   Temp (!) 97.1 F (36.2 C) (Skin)   Ht 5\' 4"  (1.626 m)   Wt 134 lb 9.6 oz (61.1 kg)   LMP 03/09/2018   BMI 23.10 kg/m   Weight change: @WEIGHTCHANGE @ Height:   Height: 5\' 4"  (162.6 cm)  Ht Readings from Last 3 Encounters:  06/15/19 5\' 4"  (1.626 m)  05/29/18 5\' 4"  (1.626 m)  05/13/17 5' 4.25" (1.632 m)    General appearance: alert, cooperative and appears stated age Head: Normocephalic, without obvious abnormality, atraumatic Neck: no adenopathy, supple, symmetrical, trachea midline and thyroid normal to inspection and palpation Lungs: clear to auscultation bilaterally Cardiovascular: regular rate and rhythm Breasts: normal appearance, no masses or tenderness Abdomen: soft, non-tender; non distended,  no masses,  no organomegaly Extremities: extremities normal, atraumatic, no cyanosis or edema Skin: Skin color,  texture, turgor normal. No rashes or lesions Lymph nodes: Cervical, supraclavicular, and axillary nodes normal. No abnormal inguinal nodes palpated Neurologic: Grossly normal   Pelvic: External genitalia:  no lesions              Urethra:  normal appearing urethra with no masses, tenderness or lesions              Bartholins and Skenes: normal                 Vagina: atrophic appearing vagina with normal color and discharge, no lesions              Cervix: no lesions               Bimanual Exam:  Uterus:   normal size, contour, position, consistency, mobility, non-tender              Adnexa: no mass, fullness, tenderness               Rectovaginal: Confirms               Anus:  normal sphincter tone, no lesions  Chaperone was present for exam.  A:  Well Woman with normal exam  H/O high TSH in 2018, high normal in 2019  PMP spotting  P:   No pap this year  Screening labs, TSH, vit D  Discussed breast self exam  Discussed calcium and vit D intake   Return for ultrasound to check stripe, possible endometrial biopsy

## 2019-06-15 NOTE — Patient Instructions (Addendum)
EXERCISE AND DIET:  We recommended that you start or continue a regular exercise program for good health. Regular exercise means any activity that makes your heart beat faster and makes you sweat.  We recommend exercising at least 30 minutes per day at least 3 days a week, preferably 4 or 5.  We also recommend a diet low in fat and sugar.  Inactivity, poor dietary choices and obesity can cause diabetes, heart attack, stroke, and kidney damage, among others.   ° °ALCOHOL AND SMOKING:  Women should limit their alcohol intake to no more than 7 drinks/beers/glasses of wine (combined, not each!) per week. Moderation of alcohol intake to this level decreases your risk of breast cancer and liver damage. And of course, no recreational drugs are part of a healthy lifestyle.  And absolutely no smoking or even second hand smoke. Most people know smoking can cause heart and lung diseases, but did you know it also contributes to weakening of your bones? Aging of your skin?  Yellowing of your teeth and nails? ° °CALCIUM AND VITAMIN D:  Adequate intake of calcium and Vitamin D are recommended.  The recommendations for exact amounts of these supplements seem to change often, but generally speaking 1,200 mg of calcium (between diet and supplement) and 800 units of Vitamin D per day seems prudent. Certain women may benefit from higher intake of Vitamin D.  If you are among these women, your doctor will have told you during your visit.   ° °PAP SMEARS:  Pap smears, to check for cervical cancer or precancers,  have traditionally been done yearly, although recent scientific advances have shown that most women can have pap smears less often.  However, every woman still should have a physical exam from her gynecologist every year. It will include a breast check, inspection of the vulva and vagina to check for abnormal growths or skin changes, a visual exam of the cervix, and then an exam to evaluate the size and shape of the uterus and  ovaries.  And after 50 years of age, a rectal exam is indicated to check for rectal cancers. We will also provide age appropriate advice regarding health maintenance, like when you should have certain vaccines, screening for sexually transmitted diseases, bone density testing, colonoscopy, mammograms, etc.  ° °MAMMOGRAMS:  All women over 40 years old should have a yearly mammogram. Many facilities now offer a "3D" mammogram, which may cost around $50 extra out of pocket. If possible,  we recommend you accept the option to have the 3D mammogram performed.  It both reduces the number of women who will be called back for extra views which then turn out to be normal, and it is better than the routine mammogram at detecting truly abnormal areas.   ° °COLON CANCER SCREENING: Now recommend starting at age 45. At this time colonoscopy is not covered for routine screening until 50. There are take home tests that can be done between 45-49.  ° °COLONOSCOPY:  Colonoscopy to screen for colon cancer is recommended for all women at age 50.  We know, you hate the idea of the prep.  We agree, BUT, having colon cancer and not knowing it is worse!!  Colon cancer so often starts as a polyp that can be seen and removed at colonscopy, which can quite literally save your life!  And if your first colonoscopy is normal and you have no family history of colon cancer, most women don't have to have it again for   10 years.  Once every ten years, you can do something that may end up saving your life, right?  We will be happy to help you get it scheduled when you are ready.  Be sure to check your insurance coverage so you understand how much it will cost.  It may be covered as a preventative service at no cost, but you should check your particular policy.   ° ° ° °Breast Self-Awareness °Breast self-awareness means being familiar with how your breasts look and feel. It involves checking your breasts regularly and reporting any changes to your  health care provider. °Practicing breast self-awareness is important. A change in your breasts can be a sign of a serious medical problem. Being familiar with how your breasts look and feel allows you to find any problems early, when treatment is more likely to be successful. All women should practice breast self-awareness, including women who have had breast implants. °How to do a breast self-exam °One way to learn what is normal for your breasts and whether your breasts are changing is to do a breast self-exam. To do a breast self-exam: °Look for Changes ° °1. Remove all the clothing above your waist. °2. Stand in front of a mirror in a room with good lighting. °3. Put your hands on your hips. °4. Push your hands firmly downward. °5. Compare your breasts in the mirror. Look for differences between them (asymmetry), such as: °? Differences in shape. °? Differences in size. °? Puckers, dips, and bumps in one breast and not the other. °6. Look at each breast for changes in your skin, such as: °? Redness. °? Scaly areas. °7. Look for changes in your nipples, such as: °? Discharge. °? Bleeding. °? Dimpling. °? Redness. °? A change in position. °Feel for Changes °Carefully feel your breasts for lumps and changes. It is best to do this while lying on your back on the floor and again while sitting or standing in the shower or tub with soapy water on your skin. Feel each breast in the following way: °· Place the arm on the side of the breast you are examining above your head. °· Feel your breast with the other hand. °· Start in the nipple area and make ¾ inch (2 cm) overlapping circles to feel your breast. Use the pads of your three middle fingers to do this. Apply light pressure, then medium pressure, then firm pressure. The light pressure will allow you to feel the tissue closest to the skin. The medium pressure will allow you to feel the tissue that is a little deeper. The firm pressure will allow you to feel the tissue  close to the ribs. °· Continue the overlapping circles, moving downward over the breast until you feel your ribs below your breast. °· Move one finger-width toward the center of the body. Continue to use the ¾ inch (2 cm) overlapping circles to feel your breast as you move slowly up toward your collarbone. °· Continue the up and down exam using all three pressures until you reach your armpit. ° °Write Down What You Find ° °Write down what is normal for each breast and any changes that you find. Keep a written record with breast changes or normal findings for each breast. By writing this information down, you do not need to depend only on memory for size, tenderness, or location. Write down where you are in your menstrual cycle, if you are still menstruating. °If you are having trouble noticing differences   in your breasts, do not get discouraged. With time you will become more familiar with the variations in your breasts and more comfortable with the exam. How often should I examine my breasts? Examine your breasts every month. If you are breastfeeding, the best time to examine your breasts is after a feeding or after using a breast pump. If you menstruate, the best time to examine your breasts is 5-7 days after your period is over. During your period, your breasts are lumpier, and it may be more difficult to notice changes. When should I see my health care provider? See your health care provider if you notice:  A change in shape or size of your breasts or nipples.  A change in the skin of your breast or nipples, such as a reddened or scaly area.  Unusual discharge from your nipples.  A lump or thick area that was not there before.  Pain in your breasts.  Anything that concerns you.   Postmenopausal Bleeding  Postmenopausal bleeding is any bleeding that a woman has after she has entered into menopause. Menopause is the end of a womans fertile years. After menopause, a woman no longer ovulates  and does not have menstrual periods. Postmenopausal bleeding may have various causes, including:  Menopausal hormone therapy (MHT).  Endometrial atrophy. After menopause, low estrogen hormone levels cause the membrane that lines the uterus (endometrium) to become thinner. You may have bleeding as the endometrium thins.  Endometrial hyperplasia. This condition is caused by excess estrogen hormones and low levels of progesterone hormones. The excess estrogen causes the endometrium to thicken, which can lead to bleeding. In some cases, this can lead to cancer of the uterus.  Endometrial cancer.  Non-cancerous growths (polyps) on the endometrium, the lining of the uterus, or the cervix.  Uterine fibroids. These are non-cancerous growths in or around the uterus muscle tissue that can cause heavy bleeding. Any type of postmenopausal bleeding, even if it appears to be a typical menstrual period, should be evaluated by your health care provider. Treatment will depend on the cause of the bleeding. Follow these instructions at home:  Pay attention to any changes in your symptoms.  Avoid using tampons and douches as told by your health care provider.  Change your pads regularly.  Get regular pelvic exams and Pap tests.  Take iron supplements as told by your health care provider.  Take over-the-counter and prescription medicines only as told by your health care provider.  Keep all follow-up visits as told by your health care provider. This is important. Contact a health care provider if:  Your bleeding lasts more than 1 week.  You have abdominal pain.  You have bleeding with or after sexual intercourse.  You have bleeding that happens more often than every 3 weeks. Get help right away if:  You have a fever, chills, headache, dizziness, muscle aches, and bleeding.  You have severe pain with bleeding.  You are passing blood clots.  You have heavy bleeding, need more than 1 pad an  hour, and have never experienced this before.  You feel faint. Summary  Postmenopausal bleeding is any bleeding that a woman has after she has entered into menopause.  Postmenopausal bleeding may have various causes. Treatment will depend on the cause of the bleeding.  Any type of postmenopausal bleeding, even if it appears to be a typical menstrual period, should be evaluated by your health care provider.  Be sure to pay attention to any changes in your  symptoms and keep all follow-up visits as told by your health care provider. This information is not intended to replace advice given to you by your health care provider. Make sure you discuss any questions you have with your health care provider. Document Released: 01/16/2006 Document Revised: 01/15/2018 Document Reviewed: 01/01/2017 Elsevier Patient Education  [place].

## 2019-06-16 LAB — LIPID PANEL
Chol/HDL Ratio: 2.3 ratio (ref 0.0–4.4)
Cholesterol, Total: 206 mg/dL — ABNORMAL HIGH (ref 100–199)
HDL: 91 mg/dL (ref >39)
LDL Calculated: 98 mg/dL (ref 0–99)
Triglycerides: 87 mg/dL (ref 0–149)
VLDL Cholesterol Cal: 17 mg/dL (ref 5–40)

## 2019-06-16 LAB — CBC
Hematocrit: 39.3 % (ref 34.0–46.6)
Hemoglobin: 13 g/dL (ref 11.1–15.9)
MCH: 32.7 pg (ref 26.6–33.0)
MCHC: 33.1 g/dL (ref 31.5–35.7)
MCV: 99 fL — ABNORMAL HIGH (ref 79–97)
Platelets: 279 10*3/uL (ref 150–450)
RBC: 3.97 x10E6/uL (ref 3.77–5.28)
RDW: 11.9 % (ref 11.7–15.4)
WBC: 4.9 10*3/uL (ref 3.4–10.8)

## 2019-06-16 LAB — COMPREHENSIVE METABOLIC PANEL
ALT: 22 IU/L (ref 0–32)
AST: 21 IU/L (ref 0–40)
Albumin/Globulin Ratio: 1.8 (ref 1.2–2.2)
Albumin: 4.8 g/dL (ref 3.8–4.8)
Alkaline Phosphatase: 79 IU/L (ref 39–117)
BUN/Creatinine Ratio: 23 (ref 9–23)
BUN: 17 mg/dL (ref 6–24)
Bilirubin Total: 1 mg/dL (ref 0.0–1.2)
CO2: 25 mmol/L (ref 20–29)
Calcium: 9.9 mg/dL (ref 8.7–10.2)
Chloride: 102 mmol/L (ref 96–106)
Creatinine, Ser: 0.74 mg/dL (ref 0.57–1.00)
GFR calc Af Amer: 109 mL/min/{1.73_m2} (ref >59)
GFR calc non Af Amer: 95 mL/min/{1.73_m2} (ref >59)
Globulin, Total: 2.6 g/dL (ref 1.5–4.5)
Glucose: 90 mg/dL (ref 65–99)
Potassium: 4.3 mmol/L (ref 3.5–5.2)
Sodium: 142 mmol/L (ref 134–144)
Total Protein: 7.4 g/dL (ref 6.0–8.5)

## 2019-06-16 LAB — TSH: TSH: 2.84 u[IU]/mL (ref 0.450–4.500)

## 2019-06-16 LAB — VITAMIN D 25 HYDROXY (VIT D DEFICIENCY, FRACTURES): Vit D, 25-Hydroxy: 46.3 ng/mL (ref 30.0–100.0)

## 2019-06-17 ENCOUNTER — Telehealth: Payer: Self-pay | Admitting: Obstetrics and Gynecology

## 2019-06-17 NOTE — Telephone Encounter (Signed)
Call placed to patient to review benefit and schedule recommended ultrasound with endometrial biopsy. Left voicemail message requesting a return call

## 2019-06-23 NOTE — Progress Notes (Signed)
GYNECOLOGY  VISIT   HPI: 48 y.o.   Married White or Caucasian Not Hispanic or Latino  female   [phone] with Patient's last menstrual period was 03/09/2018.   here for evaluation of PMP bleeding. She had one episode of spotting in 5/20, not sure if it was from her vagina or bladder.    GYNECOLOGIC HISTORY: Patient's last menstrual period was 03/09/2018. Contraception: PMP Menopausal hormone therapy: none        OB History    Gravida  2   Para  2   Term  2   Preterm  0   AB  0   Living  2     SAB      TAB      Ectopic      Multiple      Live Births  2              Patient Active Problem List   Diagnosis Date Noted  . Migraine without aura   . Congenital pulmonary stenosis   . Pulmonary stenosis   . Heart murmur     Past Medical History:  Diagnosis Date  . Congenital pulmonary stenosis   . Dysplasia of cervix, low grade (CIN 1) 1/10   f/u paps ASCUS - HPV  . Heart murmur   . Migraine without aura   . Pulmonary stenosis     Past Surgical History:  Procedure Laterality Date  . CARDIAC CATHETERIZATION  1975    Current Outpatient Medications  Medication Sig Dispense Refill  . IBUPROFEN PO Take by mouth.    . Levocetirizine Dihydrochloride (XYZAL ALLERGY 24HR PO) Take 1 tablet by mouth daily.    . Multiple Vitamin (MULTIVITAMIN PO) Take by mouth.    . Omega-3 Fatty Acids (FISH OIL PO) Take by mouth.    . SUMAtriptan (IMITREX) 100 MG tablet TAKE 1 TABLET AT ONSET OF HEADACHE 9 tablet 1  . VITAMIN D PO Take by mouth.     No current facility-administered medications for this visit.      ALLERGIES: Codeine  Family History  Problem Relation Age of Onset  . Breast cancer Neg Hx     Social History   Socioeconomic History  . Marital status: Married    Spouse name: Not on file  . Number of children: 2  . Years of education: Not on file  . Highest education level: Not on file  Occupational History  . Occupation: Social research officer, governmentinterior design  Social Needs   . Financial resource strain: Not on file  . Food insecurity    Worry: Not on file    Inability: Not on file  . Transportation needs    Medical: Not on file    Non-medical: Not on file  Tobacco Use  . Smoking status: Never Smoker  . Smokeless tobacco: Never Used  Substance and Sexual Activity  . Alcohol use: Yes    Alcohol/week: 6.0 standard drinks    Types: 6 Standard drinks or equivalent per week  . Drug use: No  . Sexual activity: Yes    Partners: Male    Birth control/protection: Surgical    Comment: vasectomy  Lifestyle  . Physical activity    Days per week: Not on file    Minutes per session: Not on file  . Stress: Not on file  Relationships  . Social Musicianconnections    Talks on phone: Not on file    Gets together: Not on file    Attends religious service:  Not on file    Active member of club or organization: Not on file    Attends meetings of clubs or organizations: Not on file    Relationship status: Not on file  . Intimate partner violence    Fear of current or ex partner: Not on file    Emotionally abused: Not on file    Physically abused: Not on file    Forced sexual activity: Not on file  Other Topics Concern  . Not on file  Social History Narrative   Husband is an Doctor, general practice with [employer]    ROS  PHYSICAL EXAMINATION:    BP 112/72 (BP Location: Left Arm, Patient Position: Sitting, Cuff Size: Normal)   Pulse 74   Temp 98.6 F (37 C) (Oral)   Ht 5\' 4"  (1.626 m)   Wt 136 lb 9.6 oz (62 kg)   LMP 03/09/2018   BMI 23.45 kg/m     General appearance: alert, cooperative and appears stated age  Ultrasound images reviewed with the patient.  ASSESSMENT Postmenopausal spotting, normal ultrasound with thin stripe    PLAN Patient reassured, call with any recurrent bleeding.    An After Visit Summary was printed and given to the patient.

## 2019-06-25 ENCOUNTER — Other Ambulatory Visit: Payer: Self-pay

## 2019-06-26 ENCOUNTER — Ambulatory Visit
Admission: RE | Admit: 2019-06-26 | Discharge: 2019-06-26 | Disposition: A | Discharge status: Home / Self Care | Payer: 59 | Source: Ambulatory Visit | Attending: Obstetrics and Gynecology | Admitting: Obstetrics and Gynecology

## 2019-06-26 DIAGNOSIS — Z1231 Encounter for screening mammogram for malignant neoplasm of breast: Secondary | ICD-10-CM

## 2019-06-30 ENCOUNTER — Ambulatory Visit (INDEPENDENT_AMBULATORY_CARE_PROVIDER_SITE_OTHER): Payer: 59 | Admitting: Obstetrics and Gynecology

## 2019-06-30 ENCOUNTER — Ambulatory Visit (INDEPENDENT_AMBULATORY_CARE_PROVIDER_SITE_OTHER): Payer: 59

## 2019-06-30 ENCOUNTER — Encounter: Payer: Self-pay | Admitting: Obstetrics and Gynecology

## 2019-06-30 ENCOUNTER — Other Ambulatory Visit: Payer: Self-pay

## 2019-06-30 ENCOUNTER — Other Ambulatory Visit: Payer: Self-pay | Admitting: Obstetrics and Gynecology

## 2019-06-30 VITALS — BP 112/72 | HR 74 | Temp 98.6°F | Ht 64.0 in | Wt 136.6 lb

## 2019-06-30 DIAGNOSIS — N950 Postmenopausal bleeding: Secondary | ICD-10-CM

## 2019-06-30 DIAGNOSIS — R928 Other abnormal and inconclusive findings on diagnostic imaging of breast: Secondary | ICD-10-CM

## 2019-07-08 ENCOUNTER — Other Ambulatory Visit: Payer: Self-pay

## 2019-07-08 ENCOUNTER — Ambulatory Visit
Admission: RE | Admit: 2019-07-08 | Discharge: 2019-07-08 | Disposition: A | Discharge status: Home / Self Care | Payer: 59 | Source: Ambulatory Visit | Attending: Obstetrics and Gynecology | Admitting: Obstetrics and Gynecology

## 2019-07-08 ENCOUNTER — Other Ambulatory Visit: Payer: Self-pay | Admitting: Obstetrics and Gynecology

## 2019-07-08 DIAGNOSIS — R928 Other abnormal and inconclusive findings on diagnostic imaging of breast: Secondary | ICD-10-CM

## 2019-07-08 DIAGNOSIS — N6320 Unspecified lump in the left breast, unspecified quadrant: Secondary | ICD-10-CM

## 2019-07-11 LAB — COLOGUARD

## 2019-07-15 NOTE — Telephone Encounter (Signed)
Ultrasound appointment completed on 06/30/2019.Will close encounter

## 2019-07-20 ENCOUNTER — Telehealth: Payer: Self-pay | Admitting: Obstetrics and Gynecology

## 2019-07-20 NOTE — Telephone Encounter (Signed)
Results of cologuard and recommendations were sent via [place].

## 2019-08-01 ENCOUNTER — Other Ambulatory Visit: Payer: Self-pay | Admitting: Obstetrics and Gynecology

## 2019-08-03 NOTE — Telephone Encounter (Signed)
Medication refill request: Imitrex Last AEX:  06/15/2019 JJ Next AEX: 06/15/2020 Last MMG (if hormonal medication request): 07/08/2019 BIRADS 3 Probably benign Density C --Follow up scheduled 01/06/2020 Refill authorized: Pending #9 with 1 RF if appropriate. Please advise.

## 2019-08-14 ENCOUNTER — Telehealth: Payer: Self-pay | Admitting: Obstetrics and Gynecology

## 2019-08-14 NOTE — Telephone Encounter (Signed)
Patent is calling for cologuard results.

## 2019-08-14 NOTE — Telephone Encounter (Signed)
Spoke with patient, advised as seen below per Dr. Talbert Nan. (see 07/20/19 telephone encounter) Patient verbalizes understanding and is agreeable.     Salvadore Dom, MD to Dawn Carmine E "Ande"   07/20/19 12:47 PM Hi Ande, Your cologuard is negative. Your next screen should be in 3 years. Please call the office with any questions. Sumner Boast  This MyChart message has not been read.     Encounter closed.

## 2020-01-06 ENCOUNTER — Other Ambulatory Visit: Payer: Self-pay | Admitting: Obstetrics and Gynecology

## 2020-01-06 ENCOUNTER — Other Ambulatory Visit: Payer: Self-pay

## 2020-01-06 ENCOUNTER — Ambulatory Visit
Admission: RE | Admit: 2020-01-06 | Discharge: 2020-01-06 | Disposition: A | Discharge status: Home / Self Care | Payer: 59 | Source: Ambulatory Visit | Attending: Obstetrics and Gynecology | Admitting: Obstetrics and Gynecology

## 2020-01-06 DIAGNOSIS — N6320 Unspecified lump in the left breast, unspecified quadrant: Secondary | ICD-10-CM

## 2020-01-13 ENCOUNTER — Other Ambulatory Visit: Payer: Self-pay | Admitting: Obstetrics and Gynecology

## 2020-01-13 NOTE — Telephone Encounter (Signed)
Medication refill request: Imitrex Last AEX:  06-15-2019 JJ  Next AEX: 07-08-20  Last MMG (if hormonal medication request): n/a Refill authorized: Today, please advise.   Medication pended for #9, 1RF. Please refill if appropriate.

## 2020-06-14 NOTE — Progress Notes (Signed)
49 y.o. G67P2002 Married White or Caucasian Not Hispanic or Latino female here for annual exam.  No vaginal bleeding. Some vaginal dryness, no dyspareunia with lubrication.     Patient's last menstrual period was 03/09/2018.          Sexually active: Yes.    The current method of family planning is post menopausal status.    Exercising: Yes.    Cross fit  Smoker:  no Health Maintenance: Pap: 05/13/2017 pap neg, HR HPV neg,04-14-14 WNL NEG HR HPV History of abnormal Pap:  Yes years ago  MMG:  01/06/20 left breast u/s Density C Bi-rads 3 probably benign. Being followed with likely complicated cyst. F/U imaging next month.  BMD:   Never  Colonoscopy: never 2019 IFOB neg, negative cologuard 07/06/19 TDaP:  05/13/2017  Gardasil: NA   reports that she has never smoked. She has never used smokeless tobacco. She reports current alcohol use of about 6.0 standard drinks of alcohol per week. She reports that she does not use drugs. Husband is an Investment banker, operational. She is an Social research officer, government, Armed forces logistics/support/administrative officer. She is getting her MBA at [employer]. Son is 21, just graduated from [employer], daughter is 62 (at [employer]).  Past Medical History:  Diagnosis Date  . Congenital pulmonary stenosis   . Dysplasia of cervix, low grade (CIN 1) 1/10   f/u paps ASCUS - HPV  . Heart murmur   . Migraine without aura   . Pulmonary stenosis     Past Surgical History:  Procedure Laterality Date  . CARDIAC CATHETERIZATION  1975    Current Outpatient Medications  Medication Sig Dispense Refill  . IBUPROFEN PO Take by mouth.    . Levocetirizine Dihydrochloride (XYZAL ALLERGY 24HR PO) Take 1 tablet by mouth daily.    . Multiple Vitamin (MULTIVITAMIN PO) Take by mouth.    . Omega-3 Fatty Acids (FISH OIL PO) Take by mouth.    . SUMAtriptan (IMITREX) 100 MG tablet TAKE 1 TABLET AT ONSET OF HEADACHE 9 tablet 1  . VITAMIN D PO Take by mouth.     No current facility-administered medications for this visit.    Family History   Problem Relation Age of Onset  . Breast cancer Neg Hx     Review of Systems  All other systems reviewed and are negative.   Exam:   BP 106/62   Pulse 76   Ht 5\' 4"  (1.626 m)   Wt 139 lb (63 kg)   LMP 03/09/2018   SpO2 98%   BMI 23.86 kg/m   Weight change: @WEIGHTCHANGE @ Height:   Height: 5\' 4"  (162.6 cm)  Ht Readings from Last 3 Encounters:  06/15/20 5\' 4"  (1.626 m)  06/30/19 5\' 4"  (1.626 m)  06/15/19 5\' 4"  (1.626 m)    General appearance: alert, cooperative and appears stated age Head: Normocephalic, without obvious abnormality, atraumatic Neck: no adenopathy, supple, symmetrical, trachea midline and thyroid normal to inspection and palpation Lungs: clear to auscultation bilaterally Cardiovascular: regular rate and rhythm Breasts: normal appearance, no masses or tenderness Abdomen: soft, non-tender; non distended,  no masses,  no organomegaly Extremities: extremities normal, atraumatic, no cyanosis or edema Skin: Skin color, texture, turgor normal. No rashes or lesions Lymph nodes: Cervical, supraclavicular, and axillary nodes normal. No abnormal inguinal nodes palpated Neurologic: Grossly normal   Pelvic: External genitalia:  no lesions              Urethra:  normal appearing urethra with no masses, tenderness or lesions  Bartholins and Skenes: normal                 Vagina: normal appearing vagina with normal color and discharge, no lesions              Cervix: no lesions               Bimanual Exam:  Uterus:  normal size, contour, position, consistency, mobility, non-tender              Adnexa: no mass, fullness, tenderness               Rectovaginal: Confirms               Anus:  normal sphincter tone, no lesions  Carolynn Serve chaperoned for the exam.  A:  Well Woman with normal exam  Vit d def  H/O elevated TSH  P:   No pap this year  Screening labs, vit d, TSH  Discussed breast self exam  Discussed calcium and vit D intake  Cologuard  UTD  Mammogram next month

## 2020-06-15 ENCOUNTER — Other Ambulatory Visit: Payer: Self-pay

## 2020-06-15 ENCOUNTER — Ambulatory Visit (INDEPENDENT_AMBULATORY_CARE_PROVIDER_SITE_OTHER): Payer: 59 | Admitting: Obstetrics and Gynecology

## 2020-06-15 ENCOUNTER — Encounter: Payer: Self-pay | Admitting: Obstetrics and Gynecology

## 2020-06-15 VITALS — BP 106/62 | HR 76 | Ht 64.0 in | Wt 139.0 lb

## 2020-06-15 DIAGNOSIS — R7989 Other specified abnormal findings of blood chemistry: Secondary | ICD-10-CM | POA: Diagnosis not present

## 2020-06-15 DIAGNOSIS — Z01419 Encounter for gynecological examination (general) (routine) without abnormal findings: Secondary | ICD-10-CM | POA: Diagnosis not present

## 2020-06-15 DIAGNOSIS — E559 Vitamin D deficiency, unspecified: Secondary | ICD-10-CM

## 2020-06-15 DIAGNOSIS — Z0000 Encounter for general adult medical examination without abnormal findings: Secondary | ICD-10-CM | POA: Diagnosis not present

## 2020-06-15 NOTE — Patient Instructions (Signed)
EXERCISE AND DIET:  We recommended that you start or continue a regular exercise program for good health. Regular exercise means any activity that makes your heart beat faster and makes you sweat.  We recommend exercising at least 30 minutes per day at least 3 days a week, preferably 4 or 5.  We also recommend a diet low in fat and sugar.  Inactivity, poor dietary choices and obesity can cause diabetes, heart attack, stroke, and kidney damage, among others.    ALCOHOL AND SMOKING:  Women should limit their alcohol intake to no more than 7 drinks/beers/glasses of wine (combined, not each!) per week. Moderation of alcohol intake to this level decreases your risk of breast cancer and liver damage. And of course, no recreational drugs are part of a healthy lifestyle.  And absolutely no smoking or even second hand smoke. Most people know smoking can cause heart and lung diseases, but did you know it also contributes to weakening of your bones? Aging of your skin?  Yellowing of your teeth and nails?  CALCIUM AND VITAMIN D:  Adequate intake of calcium and Vitamin D are recommended.  The recommendations for exact amounts of these supplements seem to change often, but generally speaking 1,200 mg of calcium (between diet and supplement) and 800 units of Vitamin D per day seems prudent. Certain women may benefit from higher intake of Vitamin D.  If you are among these women, your doctor will have told you during your visit.    PAP SMEARS:  Pap smears, to check for cervical cancer or precancers,  have traditionally been done yearly, although recent scientific advances have shown that most women can have pap smears less often.  However, every woman still should have a physical exam from her gynecologist every year. It will include a breast check, inspection of the vulva and vagina to check for abnormal growths or skin changes, a visual exam of the cervix, and then an exam to evaluate the size and shape of the uterus and  ovaries.  And after 51 years of age, a rectal exam is indicated to check for rectal cancers. We will also provide age appropriate advice regarding health maintenance, like when you should have certain vaccines, screening for sexually transmitted diseases, bone density testing, colonoscopy, mammograms, etc.   MAMMOGRAMS:  All women over 40 years old should have a yearly mammogram. Many facilities now offer a "3D" mammogram, which may cost around $50 extra out of pocket. If possible,  we recommend you accept the option to have the 3D mammogram performed.  It both reduces the number of women who will be called back for extra views which then turn out to be normal, and it is better than the routine mammogram at detecting truly abnormal areas.    COLON CANCER SCREENING: Now recommend starting at age 45. At this time colonoscopy is not covered for routine screening until 50. There are take home tests that can be done between 45-49.   COLONOSCOPY:  Colonoscopy to screen for colon cancer is recommended for all women at age 50.  We know, you hate the idea of the prep.  We agree, BUT, having colon cancer and not knowing it is worse!!  Colon cancer so often starts as a polyp that can be seen and removed at colonscopy, which can quite literally save your life!  And if your first colonoscopy is normal and you have no family history of colon cancer, most women don't have to have it again for   10 years.  Once every ten years, you can do something that may end up saving your life, right?  We will be happy to help you get it scheduled when you are ready.  Be sure to check your insurance coverage so you understand how much it will cost.  It may be covered as a preventative service at no cost, but you should check your particular policy.      Breast Self-Awareness Breast self-awareness means being familiar with how your breasts look and feel. It involves checking your breasts regularly and reporting any changes to your  health care provider. Practicing breast self-awareness is important. A change in your breasts can be a sign of a serious medical problem. Being familiar with how your breasts look and feel allows you to find any problems early, when treatment is more likely to be successful. All women should practice breast self-awareness, including women who have had breast implants. How to do a breast self-exam One way to learn what is normal for your breasts and whether your breasts are changing is to do a breast self-exam. To do a breast self-exam: Look for Changes  1. Remove all the clothing above your waist. 2. Stand in front of a mirror in a room with good lighting. 3. Put your hands on your hips. 4. Push your hands firmly downward. 5. Compare your breasts in the mirror. Look for differences between them (asymmetry), such as: ? Differences in shape. ? Differences in size. ? Puckers, dips, and bumps in one breast and not the other. 6. Look at each breast for changes in your skin, such as: ? Redness. ? Scaly areas. 7. Look for changes in your nipples, such as: ? Discharge. ? Bleeding. ? Dimpling. ? Redness. ? A change in position. Feel for Changes Carefully feel your breasts for lumps and changes. It is best to do this while lying on your back on the floor and again while sitting or standing in the shower or tub with soapy water on your skin. Feel each breast in the following way:  Place the arm on the side of the breast you are examining above your head.  Feel your breast with the other hand.  Start in the nipple area and make  inch (2 cm) overlapping circles to feel your breast. Use the pads of your three middle fingers to do this. Apply light pressure, then medium pressure, then firm pressure. The light pressure will allow you to feel the tissue closest to the skin. The medium pressure will allow you to feel the tissue that is a little deeper. The firm pressure will allow you to feel the tissue  close to the ribs.  Continue the overlapping circles, moving downward over the breast until you feel your ribs below your breast.  Move one finger-width toward the center of the body. Continue to use the  inch (2 cm) overlapping circles to feel your breast as you move slowly up toward your collarbone.  Continue the up and down exam using all three pressures until you reach your armpit.  Write Down What You Find  Write down what is normal for each breast and any changes that you find. Keep a written record with breast changes or normal findings for each breast. By writing this information down, you do not need to depend only on memory for size, tenderness, or location. Write down where you are in your menstrual cycle, if you are still menstruating. If you are having trouble noticing differences   in your breasts, do not get discouraged. With time you will become more familiar with the variations in your breasts and more comfortable with the exam. How often should I examine my breasts? Examine your breasts every month. If you are breastfeeding, the best time to examine your breasts is after a feeding or after using a breast pump. If you menstruate, the best time to examine your breasts is 5-7 days after your period is over. During your period, your breasts are lumpier, and it may be more difficult to notice changes. When should I see my health care provider? See your health care provider if you notice:  A change in shape or size of your breasts or nipples.  A change in the skin of your breast or nipples, such as a reddened or scaly area.  Unusual discharge from your nipples.  A lump or thick area that was not there before.  Pain in your breasts.  Anything that concerns you.  

## 2020-06-16 LAB — COMPREHENSIVE METABOLIC PANEL
ALT: 23 IU/L (ref 0–32)
AST: 22 IU/L (ref 0–40)
Albumin/Globulin Ratio: 1.7 (ref 1.2–2.2)
Albumin: 4.7 g/dL (ref 3.8–4.9)
Alkaline Phosphatase: 87 IU/L (ref 48–121)
BUN/Creatinine Ratio: 20 (ref 9–23)
BUN: 17 mg/dL (ref 6–24)
Bilirubin Total: 0.9 mg/dL (ref 0.0–1.2)
CO2: 25 mmol/L (ref 20–29)
Calcium: 9.5 mg/dL (ref 8.7–10.2)
Chloride: 101 mmol/L (ref 96–106)
Creatinine, Ser: 0.87 mg/dL (ref 0.57–1.00)
GFR calc Af Amer: 89 mL/min/{1.73_m2} (ref >59)
GFR calc non Af Amer: 77 mL/min/{1.73_m2} (ref >59)
Globulin, Total: 2.7 g/dL (ref 1.5–4.5)
Glucose: 86 mg/dL (ref 65–99)
Potassium: 4.3 mmol/L (ref 3.5–5.2)
Sodium: 140 mmol/L (ref 134–144)
Total Protein: 7.4 g/dL (ref 6.0–8.5)

## 2020-06-16 LAB — TSH: TSH: 3.06 u[IU]/mL (ref 0.450–4.500)

## 2020-06-16 LAB — CBC
Hematocrit: 39.5 % (ref 34.0–46.6)
Hemoglobin: 13.3 g/dL (ref 11.1–15.9)
MCH: 33 pg (ref 26.6–33.0)
MCHC: 33.7 g/dL (ref 31.5–35.7)
MCV: 98 fL — ABNORMAL HIGH (ref 79–97)
Platelets: 299 10*3/uL (ref 150–450)
RBC: 4.03 x10E6/uL (ref 3.77–5.28)
RDW: 12 % (ref 11.7–15.4)
WBC: 5.9 10*3/uL (ref 3.4–10.8)

## 2020-06-16 LAB — LIPID PANEL
Chol/HDL Ratio: 2.5 ratio (ref 0.0–4.4)
Cholesterol, Total: 234 mg/dL — ABNORMAL HIGH (ref 100–199)
HDL: 92 mg/dL (ref >39)
LDL Chol Calc (NIH): 126 mg/dL — ABNORMAL HIGH (ref 0–99)
Triglycerides: 94 mg/dL (ref 0–149)
VLDL Cholesterol Cal: 16 mg/dL (ref 5–40)

## 2020-06-16 LAB — VITAMIN D 25 HYDROXY (VIT D DEFICIENCY, FRACTURES): Vit D, 25-Hydroxy: 31.2 ng/mL (ref 30.0–100.0)

## 2020-07-08 ENCOUNTER — Other Ambulatory Visit: Payer: Self-pay

## 2020-07-08 ENCOUNTER — Ambulatory Visit
Admission: RE | Admit: 2020-07-08 | Discharge: 2020-07-08 | Disposition: A | Discharge status: Home / Self Care | Payer: 59 | Source: Ambulatory Visit | Attending: Obstetrics and Gynecology | Admitting: Obstetrics and Gynecology

## 2020-07-08 DIAGNOSIS — N6320 Unspecified lump in the left breast, unspecified quadrant: Secondary | ICD-10-CM

## 2020-08-07 ENCOUNTER — Other Ambulatory Visit: Payer: Self-pay | Admitting: Obstetrics and Gynecology

## 2020-08-07 DIAGNOSIS — G43809 Other migraine, not intractable, without status migrainosus: Secondary | ICD-10-CM

## 2020-08-08 NOTE — Telephone Encounter (Signed)
Medication refill request: Imitrex  Last AEX:  06/15/20 Next AEX: 06/21/21 Last MMG (if hormonal medication request): NA Refill authorized: 9/1

## 2020-12-17 ENCOUNTER — Other Ambulatory Visit: Payer: Self-pay | Admitting: Obstetrics and Gynecology

## 2020-12-17 DIAGNOSIS — G43809 Other migraine, not intractable, without status migrainosus: Secondary | ICD-10-CM

## 2020-12-20 NOTE — Telephone Encounter (Signed)
Medication refill request: Imitrex #9, R1 Last AEX:  06-15-20 Next AEX: 06-21-21 Last MMG (if hormonal medication request): n/ Refill authorized: Please refill if appropriate

## 2021-05-21 ENCOUNTER — Other Ambulatory Visit: Payer: Self-pay | Admitting: Obstetrics and Gynecology

## 2021-05-21 DIAGNOSIS — G43809 Other migraine, not intractable, without status migrainosus: Secondary | ICD-10-CM

## 2021-05-23 NOTE — Telephone Encounter (Signed)
Annual exam scheduled on 06/21/21

## 2021-06-14 ENCOUNTER — Other Ambulatory Visit: Payer: Self-pay | Admitting: Obstetrics and Gynecology

## 2021-06-14 DIAGNOSIS — N6320 Unspecified lump in the left breast, unspecified quadrant: Secondary | ICD-10-CM

## 2021-06-21 ENCOUNTER — Other Ambulatory Visit: Payer: Self-pay

## 2021-06-21 ENCOUNTER — Ambulatory Visit (INDEPENDENT_AMBULATORY_CARE_PROVIDER_SITE_OTHER): Payer: 59 | Admitting: Obstetrics and Gynecology

## 2021-06-21 ENCOUNTER — Encounter: Payer: Self-pay | Admitting: Obstetrics and Gynecology

## 2021-06-21 VITALS — BP 110/64 | HR 69 | Ht 64.0 in | Wt 142.0 lb

## 2021-06-21 DIAGNOSIS — Z01419 Encounter for gynecological examination (general) (routine) without abnormal findings: Secondary | ICD-10-CM | POA: Diagnosis not present

## 2021-06-21 NOTE — Progress Notes (Signed)
50 y.o. G54P2002 Married White or Caucasian Not Hispanic or Latino female here for annual exam.  No vaginal bleeding. No vasomotor symptoms. Uses lubrication on occasion, no pain.    Patient's last menstrual period was 03/09/2018.          Sexually active: Yes.    The current method of family planning is post menopausal status.    Exercising: Yes.     Cross fit and walking  Smoker:  no  Health Maintenance: Pap:  05/13/2017 pap neg, HR HPV neg, 04-14-14 WNL NEG HR HPV  History of abnormal Pap:  yes years ago, f/u was normal.   MMG:  07/08/20 Bi-rads 3 probably benign Left breast US 07/08/20 no mammographic evidence of malignancy on the right left Bi-rads 3 probably benign  BMD:   never  Colonoscopy: never  2019 Ifob neg  Cologuard 07/06/19: negative.  TDaP:  05/13/17  Gardasil: n/a   reports that she has never smoked. She has never used smokeless tobacco. She reports current alcohol use of about 6.0 standard drinks per week. She reports that she does not use drugs.Drinks 4-6 drinks a week. Husband is an Investment banker, operational (foot and ankle). She is an Social research officer, government, Armed forces logistics/support/administrative officer. She is getting her MBA at [employer] (part time). Son is 45, graduated from [employer], daughter 4, graduated from [employer]. Daughter is at [employer].  Past Medical History:  Diagnosis Date   Congenital pulmonary stenosis    Dysplasia of cervix, low grade (CIN 1) 1/10   f/u paps ASCUS - HPV   Heart murmur    Migraine without aura    Pulmonary stenosis     Past Surgical History:  Procedure Laterality Date   CARDIAC CATHETERIZATION  1975    Current Outpatient Medications  Medication Sig Dispense Refill   IBUPROFEN PO Take by mouth.     Levocetirizine Dihydrochloride (XYZAL ALLERGY 24HR PO) Take 1 tablet by mouth daily.     Multiple Vitamin (MULTIVITAMIN PO) Take by mouth.     Omega-3 Fatty Acids (FISH OIL PO) Take by mouth.     SUMAtriptan (IMITREX) 100 MG tablet TAKE 1 TABLET AT ONSET OF HEADACHE 9 tablet 0    VITAMIN D PO Take by mouth.     No current facility-administered medications for this visit.    Family History  Problem Relation Age of Onset   Breast cancer Neg Hx     Review of Systems  Exam:   BP 110/64   Pulse 69   Ht 5\' 4"  (1.626 m)   Wt 142 lb (64.4 kg)   LMP 03/09/2018   SpO2 100%   BMI 24.37 kg/m   Weight change: @WEIGHTCHANGE @ Height:   Height: 5\' 4"  (162.6 cm)  Ht Readings from Last 3 Encounters:  06/21/21 5\' 4"  (1.626 m)  06/15/20 5\' 4"  (1.626 m)  06/30/19 5\' 4"  (1.626 m)    General appearance: alert, cooperative and appears stated age Head: Normocephalic, without obvious abnormality, atraumatic Neck: no adenopathy, supple, symmetrical, trachea midline and thyroid normal to inspection and palpation Lungs: clear to auscultation bilaterally Cardiovascular: regular rate and rhythm Breasts: normal appearance, no masses or tenderness Abdomen: soft, non-tender; non distended,  no masses,  no organomegaly Extremities: extremities normal, atraumatic, no cyanosis or edema Skin: Skin color, texture, turgor normal. No rashes or lesions Lymph nodes: Cervical, supraclavicular, and axillary nodes normal. No abnormal inguinal nodes palpated Neurologic: Grossly normal   Pelvic: External genitalia:  no lesions  Urethra:  normal appearing urethra with no masses, tenderness or lesions              Bartholins and Skenes: normal                 Vagina: atrophic appearing vagina with normal color and discharge, no lesions              Cervix: no lesions               Bimanual Exam:  Uterus:  normal size, contour, position, consistency, mobility, non-tender              Adnexa: no mass, fullness, tenderness               Rectovaginal: Confirms               Anus:  normal sphincter tone, no lesions   1. Well woman exam Discussed breast self exam Discussed calcium and vit D intake Mammogram next month Colonoscopy next year She will do her labs through  work

## 2021-06-21 NOTE — Patient Instructions (Addendum)
Try Uberlube for vaginal lubrication  EXERCISE   We recommended that you start or continue a regular exercise program for good health. Physical activity is anything that gets your body moving, some is better than none. The CDC recommends 150 minutes per week of Moderate-Intensity Aerobic Activity and 2 or more days of Muscle Strengthening Activity.  Benefits of exercise are limitless: helps weight loss/weight maintenance, improves mood and energy, helps with depression and anxiety, improves sleep, tones and strengthens muscles, improves balance, improves bone density, protects from chronic conditions such as heart disease, high blood pressure and diabetes and so much more. To learn more visit: https://www.cdc.gov/physicalactivity/index.html  DIET: Good nutrition starts with a healthy diet of fruits, vegetables, whole grains, and lean protein sources. Drink plenty of water for hydration. Minimize empty calories, sodium, sweets. For more information about dietary recommendations visit: https://health.gov/our-work/nutrition-physical-activity/dietary-guidelines and https://www.myplate.gov/  ALCOHOL:  Women should limit their alcohol intake to no more than 7 drinks/beers/glasses of wine (combined, not each!) per week. Moderation of alcohol intake to this level decreases your risk of breast cancer and liver damage.  If you are concerned that you may have a problem, or your friends have told you they are concerned about your drinking, there are many resources to help. A well-known program that is free, effective, and available to all people all over the nation is Alcoholics Anonymous.  Check out this site to learn more: https://www.aa.org/   CALCIUM AND VITAMIN D:  Adequate intake of calcium and Vitamin D are recommended for bone health.  You should be getting between 1000-1200 mg of calcium and 800 units of Vitamin D daily between diet and supplements  PAP SMEARS:  Pap smears, to check for cervical cancer or  precancers,  have traditionally been done yearly, scientific advances have shown that most women can have pap smears less often.  However, every woman still should have a physical exam from her gynecologist every year. It will include a breast check, inspection of the vulva and vagina to check for abnormal growths or skin changes, a visual exam of the cervix, and then an exam to evaluate the size and shape of the uterus and ovaries. We will also provide age appropriate advice regarding health maintenance, like when you should have certain vaccines, screening for sexually transmitted diseases, bone density testing, colonoscopy, mammograms, etc.   MAMMOGRAMS:  All women over 40 years old should have a routine mammogram.   COLON CANCER SCREENING: Now recommend starting at age 45. At this time colonoscopy is not covered for routine screening until 50. There are take home tests that can be done between 45-49.   COLONOSCOPY:  Colonoscopy to screen for colon cancer is recommended for all women at age 50.  We know, you hate the idea of the prep.  We agree, BUT, having colon cancer and not knowing it is worse!!  Colon cancer so often starts as a polyp that can be seen and removed at colonscopy, which can quite literally save your life!  And if your first colonoscopy is normal and you have no family history of colon cancer, most women don't have to have it again for 10 years.  Once every ten years, you can do something that may end up saving your life, right?  We will be happy to help you get it scheduled when you are ready.  Be sure to check your insurance coverage so you understand how much it will cost.  It may be covered as a preventative service at   no cost, but you should check your particular policy.      Breast Self-Awareness Breast self-awareness means being familiar with how your breasts look and feel. It involves checking your breasts regularly and reporting any changes to your health care  provider. Practicing breast self-awareness is important. A change in your breasts can be a sign of a serious medical problem. Being familiar with how your breasts look and feel allows you to find any problems early, when treatment is more likely to be successful. All women should practice breast self-awareness, including women who have had breast implants. How to do a breast self-exam One way to learn what is normal for your breasts and whether your breasts are changing is to do a breast self-exam. To do a breast self-exam: Look for Changes  Remove all the clothing above your waist. Stand in front of a mirror in a room with good lighting. Put your hands on your hips. Push your hands firmly downward. Compare your breasts in the mirror. Look for differences between them (asymmetry), such as: Differences in shape. Differences in size. Puckers, dips, and bumps in one breast and not the other. Look at each breast for changes in your skin, such as: Redness. Scaly areas. Look for changes in your nipples, such as: Discharge. Bleeding. Dimpling. Redness. A change in position. Feel for Changes Carefully feel your breasts for lumps and changes. It is best to do this while lying on your back on the floor and again while sitting or standing in the shower or tub with soapy water on your skin. Feel each breast in the following way: Place the arm on the side of the breast you are examining above your head. Feel your breast with the other hand. Start in the nipple area and make  inch (2 cm) overlapping circles to feel your breast. Use the pads of your three middle fingers to do this. Apply light pressure, then medium pressure, then firm pressure. The light pressure will allow you to feel the tissue closest to the skin. The medium pressure will allow you to feel the tissue that is a little deeper. The firm pressure will allow you to feel the tissue close to the ribs. Continue the overlapping circles,  moving downward over the breast until you feel your ribs below your breast. Move one finger-width toward the center of the body. Continue to use the  inch (2 cm) overlapping circles to feel your breast as you move slowly up toward your collarbone. Continue the up and down exam using all three pressures until you reach your armpit.  Write Down What You Find  Write down what is normal for each breast and any changes that you find. Keep a written record with breast changes or normal findings for each breast. By writing this information down, you do not need to depend only on memory for size, tenderness, or location. Write down where you are in your menstrual cycle, if you are still menstruating. If you are having trouble noticing differences in your breasts, do not get discouraged. With time you will become more familiar with the variations in your breasts and more comfortable with the exam. How often should I examine my breasts? Examine your breasts every month. If you are breastfeeding, the best time to examine your breasts is after a feeding or after using a breast pump. If you menstruate, the best time to examine your breasts is 5-7 days after your period is over. During your period, your breasts are   lumpier, and it may be more difficult to notice changes. When should I see my health care provider? See your health care provider if you notice: A change in shape or size of your breasts or nipples. A change in the skin of your breast or nipples, such as a reddened or scaly area. Unusual discharge from your nipples. A lump or thick area that was not there before. Pain in your breasts. Anything that concerns you.  

## 2021-06-26 ENCOUNTER — Other Ambulatory Visit: Payer: Self-pay | Admitting: Obstetrics and Gynecology

## 2021-06-26 DIAGNOSIS — G43809 Other migraine, not intractable, without status migrainosus: Secondary | ICD-10-CM

## 2021-06-28 NOTE — Telephone Encounter (Signed)
Left message in voice mail at pharmacy as patient just received Rx 05/23/21 for #9 with no refills. I wanted to see if she had picked that one up and was requesting another.

## 2021-07-18 ENCOUNTER — Ambulatory Visit
Admission: RE | Admit: 2021-07-18 | Discharge: 2021-07-18 | Disposition: A | Discharge status: Home / Self Care | Payer: 59 | Source: Ambulatory Visit | Attending: Obstetrics and Gynecology | Admitting: Obstetrics and Gynecology

## 2021-07-18 ENCOUNTER — Other Ambulatory Visit: Payer: Self-pay

## 2021-07-18 DIAGNOSIS — N6320 Unspecified lump in the left breast, unspecified quadrant: Secondary | ICD-10-CM

## 2022-01-02 DIAGNOSIS — G43009 Migraine without aura, not intractable, without status migrainosus: Secondary | ICD-10-CM | POA: Diagnosis not present

## 2022-01-06 IMAGING — MG DIGITAL DIAGNOSTIC BILAT W/ TOMO W/ CAD
8 series · 8 of 24 positions shown · non-contrast
Comparison: Previous exam(s).

CLINICAL DATA: 50-year-old female presenting for annual bilateral
mammogram and final 2 year follow-up of a probably benign left
breast mass.

EXAM:
DIGITAL DIAGNOSTIC BILATERAL MAMMOGRAM WITH TOMOSYNTHESIS AND CAD;
ULTRASOUND LEFT BREAST LIMITED
TECHNIQUE: Bilateral digital diagnostic mammography and breast tomosynthesis
was performed. The images were evaluated with computer-aided
detection.; Targeted ultrasound examination of the left breast was
performed.

[L MLO synth-2D]
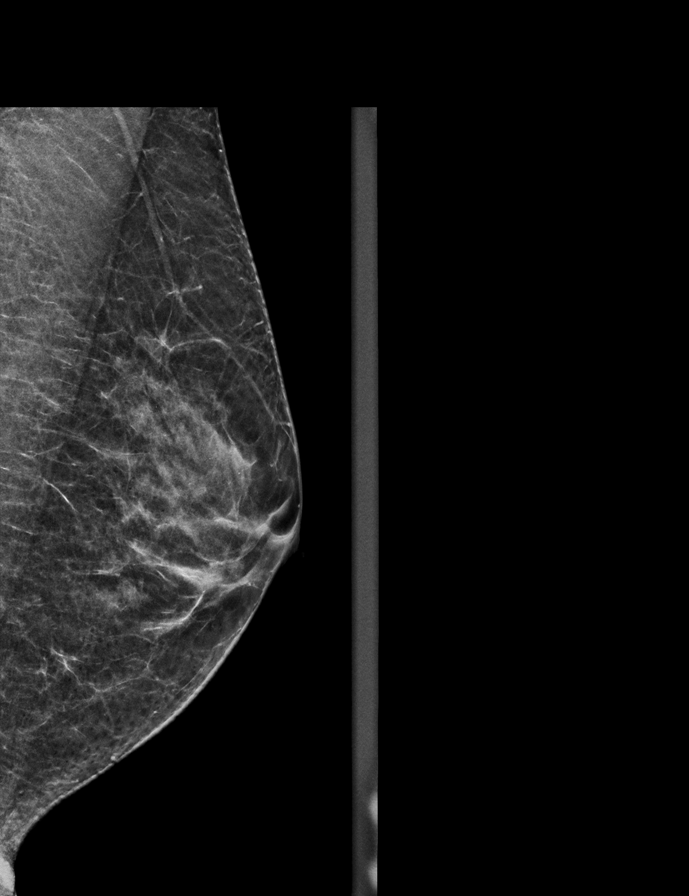

[R CC synth-2D]
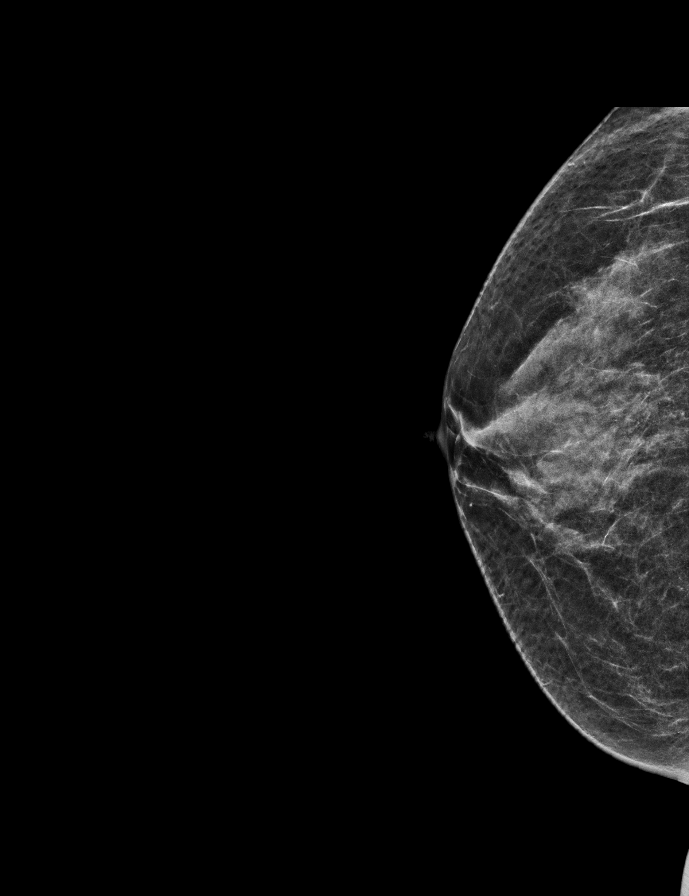

[L CC synth-2D]
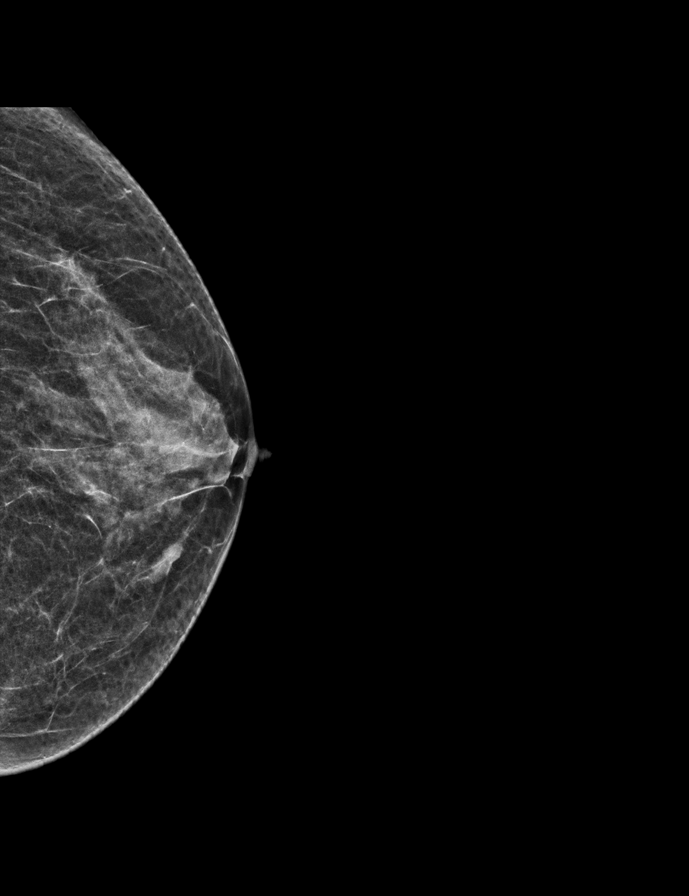

[R MLO synth-2D]
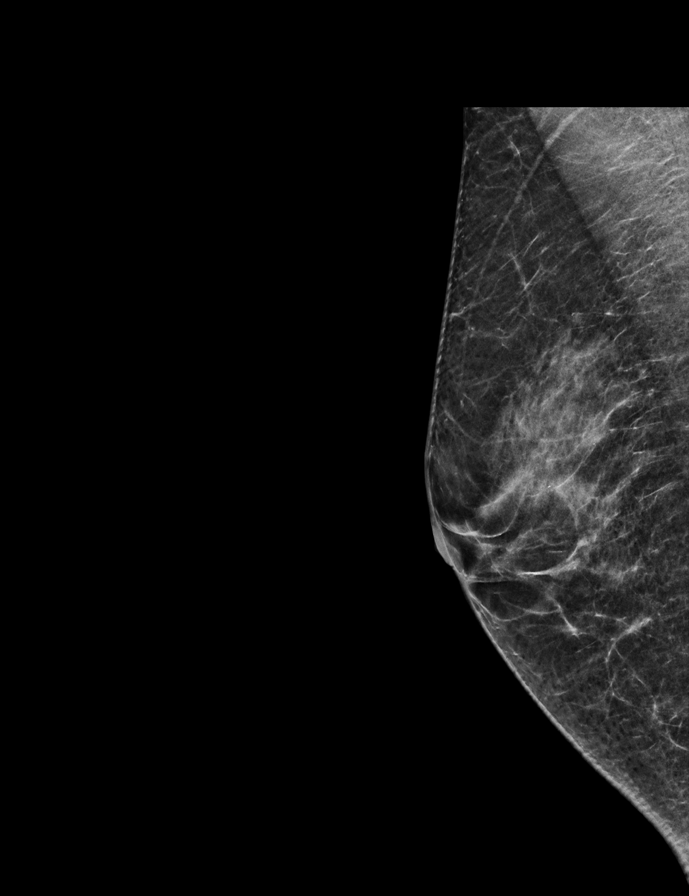

[L MLO tomo · tomo slice 23/46.0]
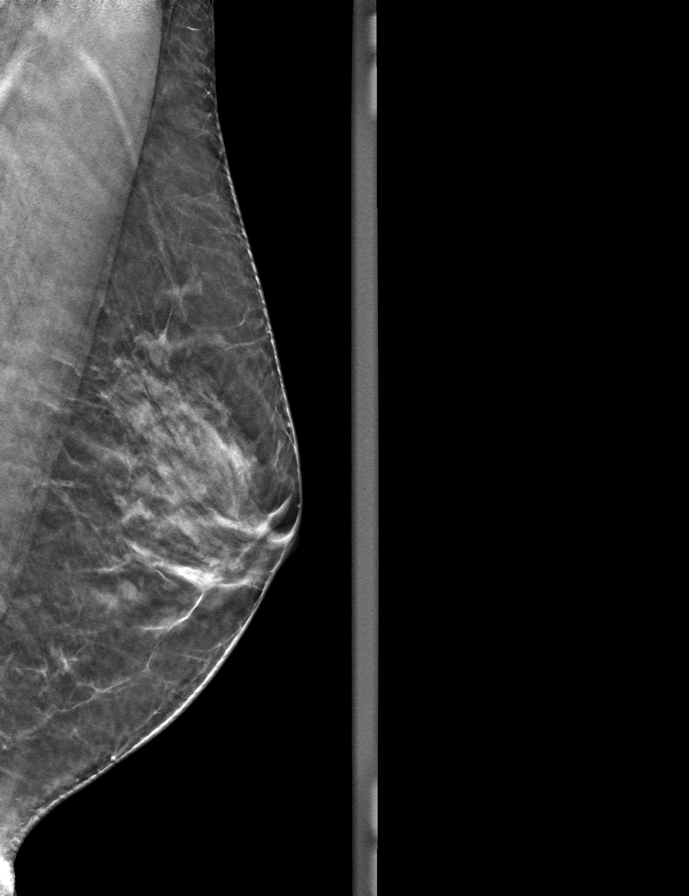

[R CC tomo · tomo slice 25/49.0]
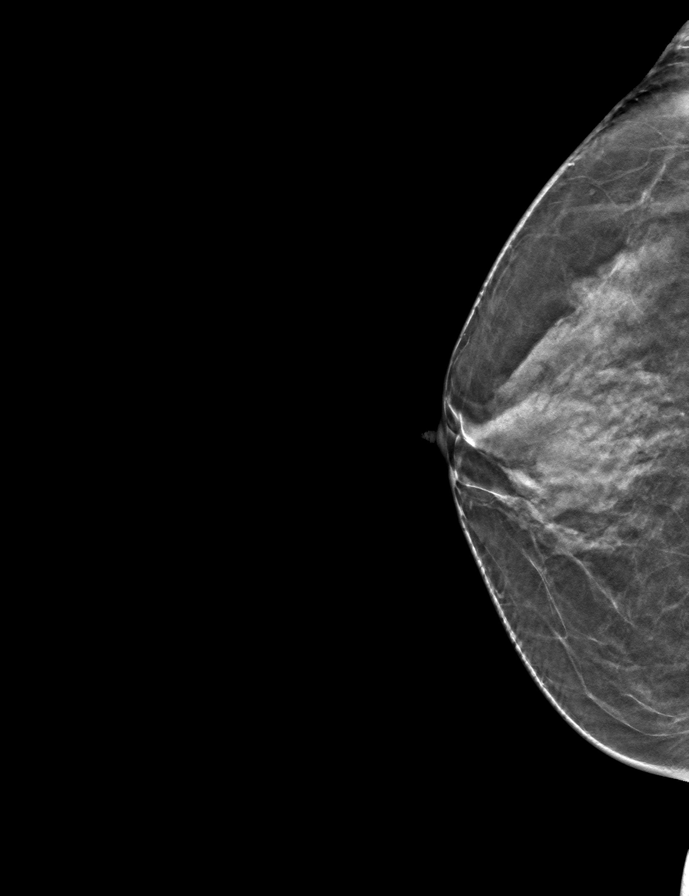

[R MLO tomo · tomo slice 25/49.0]
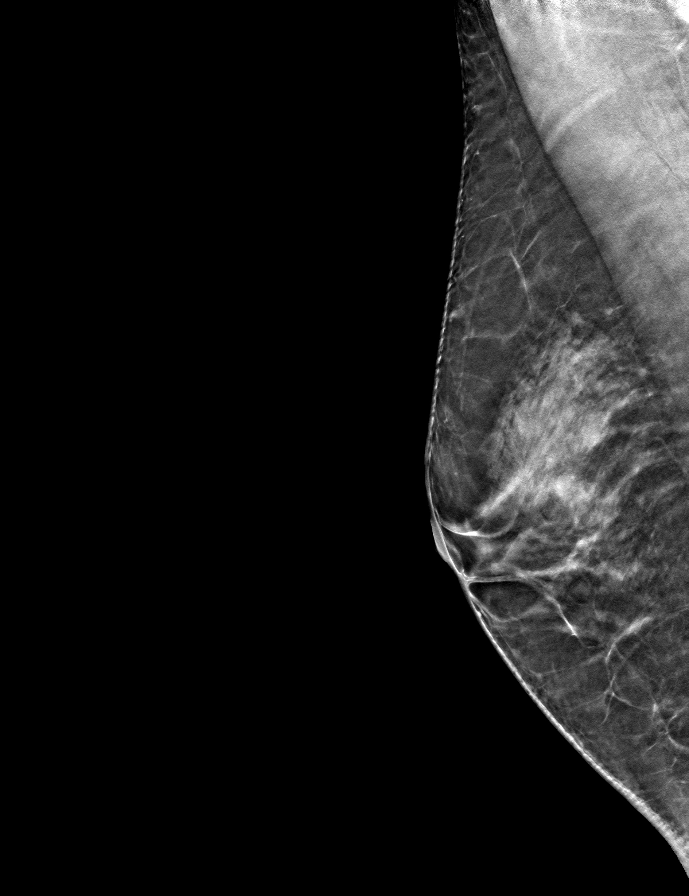

[L CC tomo · tomo slice 23/45.0]
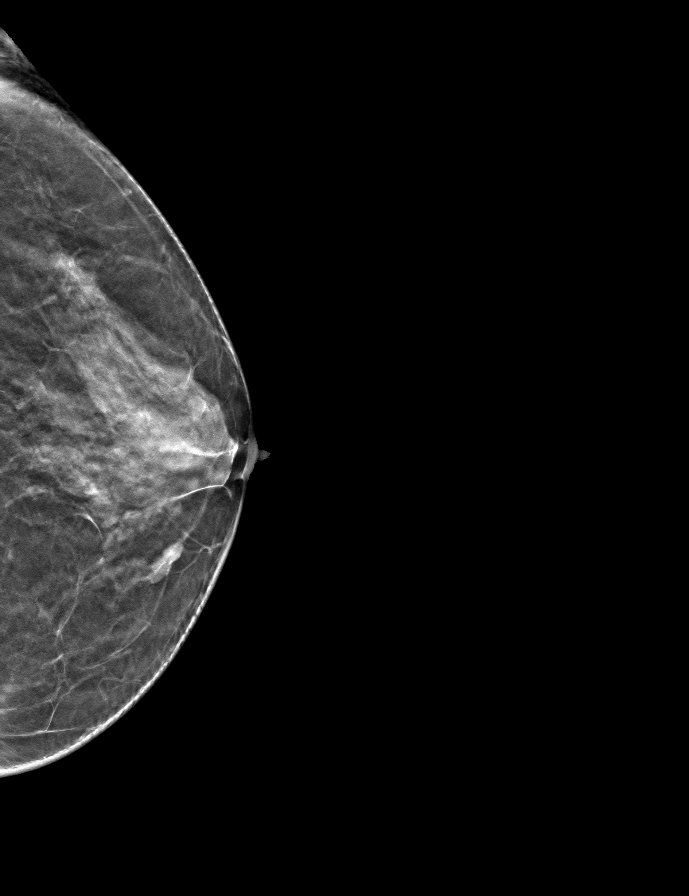

[8 of 24 positions shown; findings below may reference images not displayed]

ACR Breast Density Category c: The breast tissue is heterogeneously
dense, which may obscure small masses.
FINDINGS: No new or suspicious mammographic findings are identified in either
breast. The parenchymal pattern is stable. An oval, circumscribed
equal density mass in the medial left breast at anterior depth is
seen to varying degrees on numerous prior and remote studies.

The

Targeted ultrasound is performed, showing interval decrease in size
of a multi-cystic mass at the 10 o'clock position 4 cm from the
nipple. Today it measures 4 x 2 x 4 mm (previously 5 x 5 x 4 mm). An
additional oval, circumscribed nearly anechoic mass with a single
thin internal septation is noted at the 10 o'clock retroareolar
position. It measures 7 x 2 x 6 mm. There is no internal
vascularity. This likely corresponds with the additional waxing and
waning mass identified medially.
IMPRESSION: 1. Benign left breast cysts.
2. Otherwise no mammographic evidence of malignancy in either
breast.

RECOMMENDATION:
Screening mammogram in one year.(Code:S8-Q-E1Z)

I have discussed the findings and recommendations with the patient.
If applicable, a reminder letter will be sent to the patient
regarding the next appointment.

BI-RADS CATEGORY  2: Benign.

## 2022-01-06 IMAGING — US US BREAST*L* LIMITED INC AXILLA
2 series · 12 of 12 positions shown · non-contrast
Comparison: Previous exam(s).

CLINICAL DATA: 50-year-old female presenting for annual bilateral
mammogram and final 2 year follow-up of a probably benign left
breast mass.

EXAM:
DIGITAL DIAGNOSTIC BILATERAL MAMMOGRAM WITH TOMOSYNTHESIS AND CAD;
ULTRASOUND LEFT BREAST LIMITED
TECHNIQUE: Bilateral digital diagnostic mammography and breast tomosynthesis
was performed. The images were evaluated with computer-aided
detection.; Targeted ultrasound examination of the left breast was
performed.

[Series 1: us breast*left* limited inc axilla · 0.06mm/px · 5 of 5 slices shown (1 of 2)]
[im 1/5]
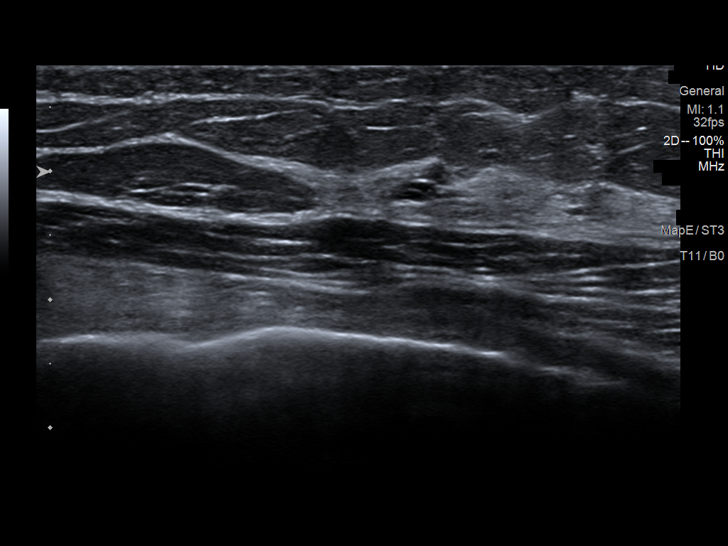
[im 2/5]
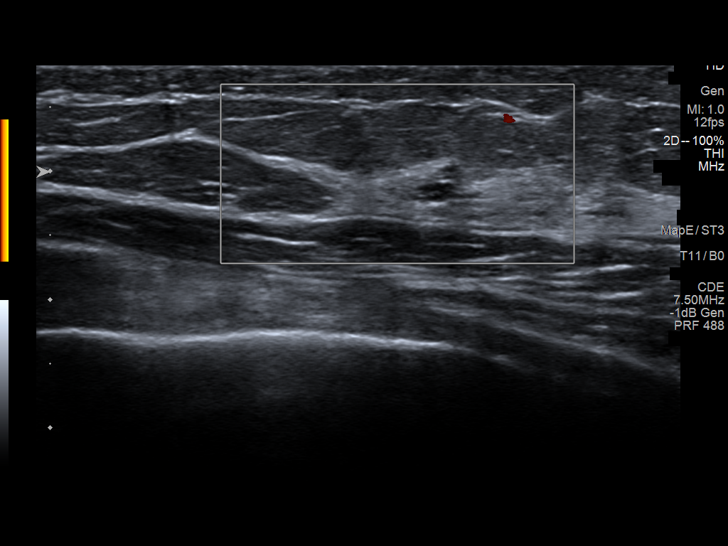
[im 3/5]
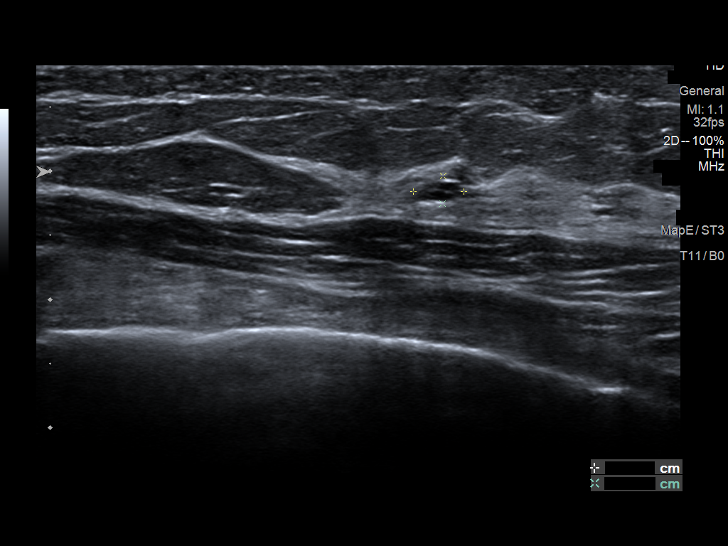
[im 4/5]
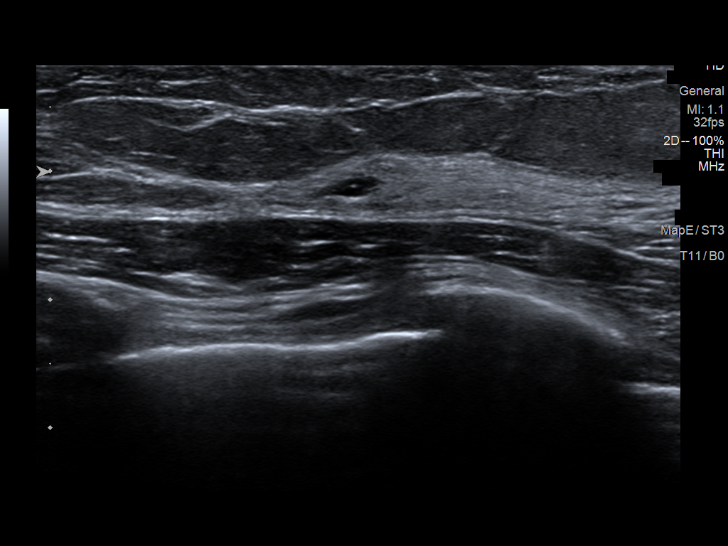
[im 5/5]
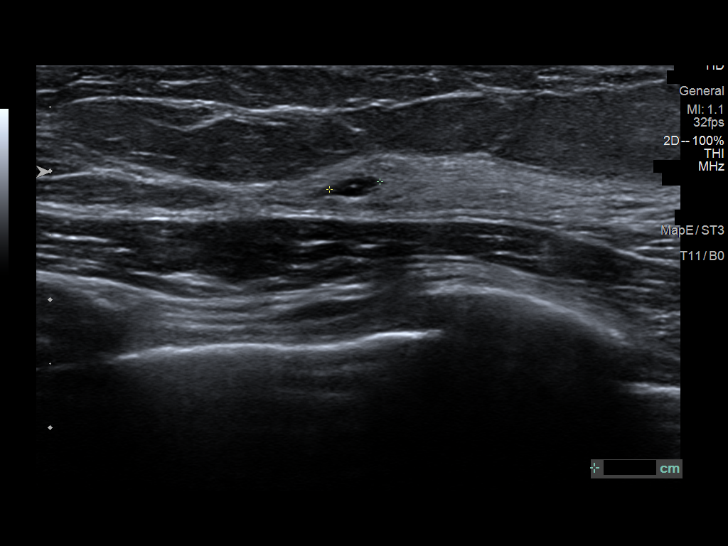

[Series 2: us breast*left* limited inc axilla · 0.06mm/px · 7 of 7 slices shown (2 of 2)]
[im 1/7]
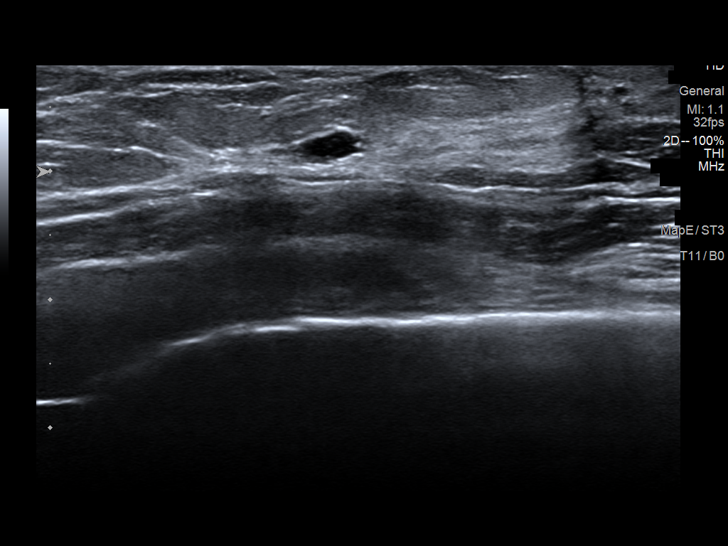
[im 2/7]
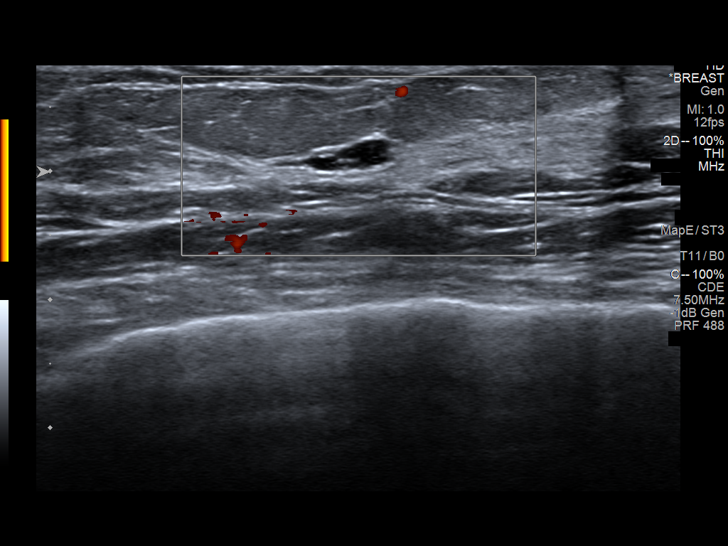
[im 3/7]
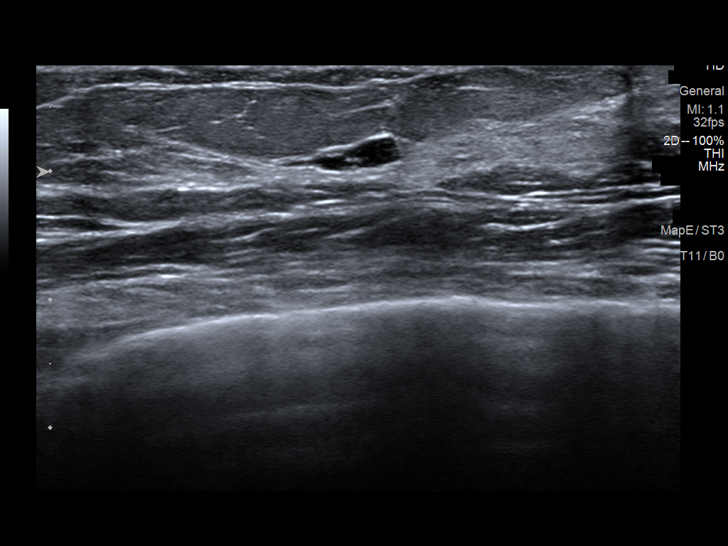
[im 4/7]
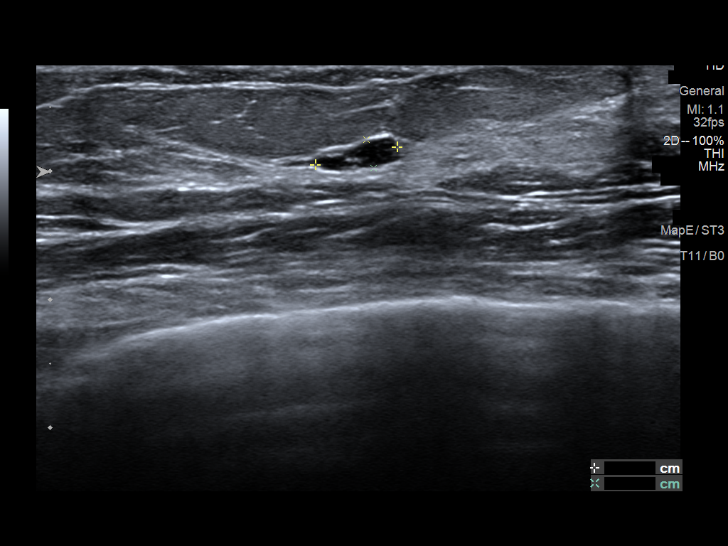
[im 5/7]
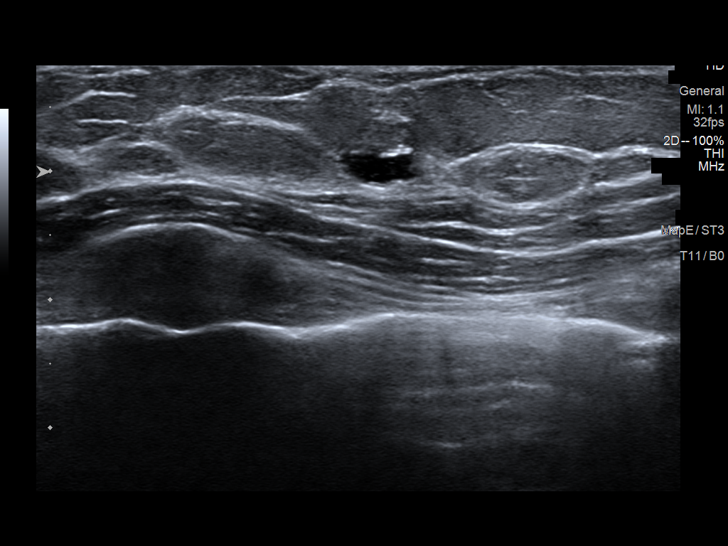
[im 6/7]
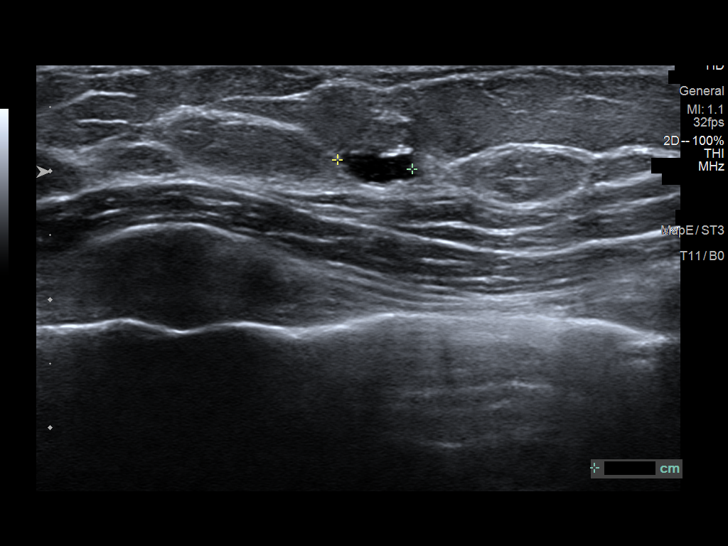
[im 7/7]
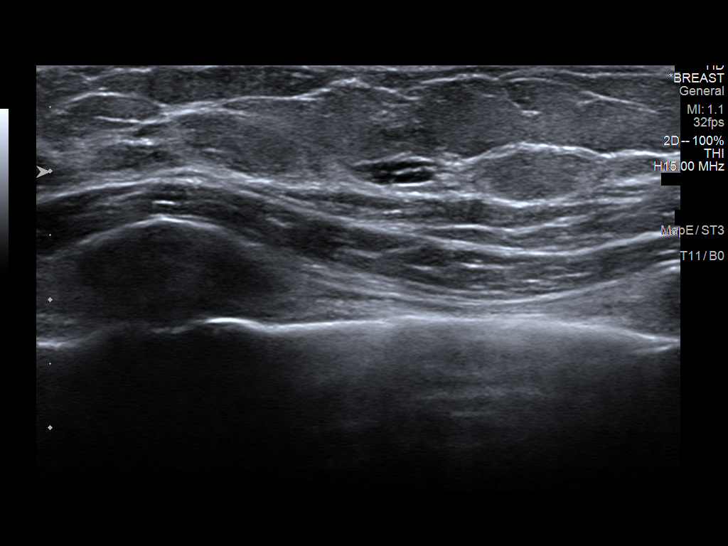

[12 of 12 positions shown; findings below may reference images not displayed]

ACR Breast Density Category c: The breast tissue is heterogeneously
dense, which may obscure small masses.
FINDINGS: No new or suspicious mammographic findings are identified in either
breast. The parenchymal pattern is stable. An oval, circumscribed
equal density mass in the medial left breast at anterior depth is
seen to varying degrees on numerous prior and remote studies.

The

Targeted ultrasound is performed, showing interval decrease in size
of a multi-cystic mass at the 10 o'clock position 4 cm from the
nipple. Today it measures 4 x 2 x 4 mm (previously 5 x 5 x 4 mm). An
additional oval, circumscribed nearly anechoic mass with a single
thin internal septation is noted at the 10 o'clock retroareolar
position. It measures 7 x 2 x 6 mm. There is no internal
vascularity. This likely corresponds with the additional waxing and
waning mass identified medially.
IMPRESSION: 1. Benign left breast cysts.
2. Otherwise no mammographic evidence of malignancy in either
breast.

RECOMMENDATION:
Screening mammogram in one year.(Code:S8-Q-E1Z)

I have discussed the findings and recommendations with the patient.
If applicable, a reminder letter will be sent to the patient
regarding the next appointment.

BI-RADS CATEGORY  2: Benign.

## 2022-01-08 DIAGNOSIS — D225 Melanocytic nevi of trunk: Secondary | ICD-10-CM | POA: Diagnosis not present

## 2022-01-08 DIAGNOSIS — D2372 Other benign neoplasm of skin of left lower limb, including hip: Secondary | ICD-10-CM | POA: Diagnosis not present

## 2022-06-04 ENCOUNTER — Other Ambulatory Visit: Payer: Self-pay | Admitting: Obstetrics and Gynecology

## 2022-06-04 DIAGNOSIS — Z1231 Encounter for screening mammogram for malignant neoplasm of breast: Secondary | ICD-10-CM

## 2022-06-26 ENCOUNTER — Ambulatory Visit: Payer: 59 | Admitting: Obstetrics and Gynecology

## 2022-07-09 NOTE — Progress Notes (Signed)
51 y.o. G73P2002 Married White or Caucasian Not Hispanic or Latino female here for annual exam.  No bleeding. No dyspareunia.     Patient's last menstrual period was 03/09/2018.          Sexually active: Yes.    The current method of family planning is post menopausal status.    Exercising: Yes.     Walking and cross fit  Smoker:  no  Health Maintenance: Pap:  05/13/2017 pap neg, HR HPV neg, 04-14-14 WNL NEG HR HPV  History of abnormal Pap:  yes years ago, f/u was normal.   MMG:  07/18/21 density C Bi-rads 2 benign  BMD:   n/a Colonoscopy: never, has one scheduled.  Cologuard 07/06/19  normal  TDaP:  05/13/17 Gardasil: n/a   reports that she has never smoked. She has never used smokeless tobacco. She reports current alcohol use of about 6.0 standard drinks of alcohol per week. She reports that she does not use drugs. Husband is an Investment banker, operational (foot and ankle). She is an Network engineer, Armed forces logistics/support/administrative officer. She is getting her MBA at [employer] (part time). Son is 96, works as a Diplomatic Services operational officer.  Daughter 52,  in her second year at [employer].  Past Medical History:  Diagnosis Date   Congenital pulmonary stenosis    Dysplasia of cervix, low grade (CIN 1) 1/10   f/u paps ASCUS - HPV   Heart murmur    Migraine without aura    Pulmonary stenosis     Past Surgical History:  Procedure Laterality Date   CARDIAC CATHETERIZATION  1975    Current Outpatient Medications  Medication Sig Dispense Refill   IBUPROFEN PO Take by mouth.     Levocetirizine Dihydrochloride (XYZAL ALLERGY 24HR PO) Take 1 tablet by mouth daily.     Multiple Vitamin (MULTIVITAMIN PO) Take by mouth.     Omega-3 Fatty Acids (FISH OIL PO) Take by mouth.     UBRELVY 50 MG TABS Take by mouth.     VITAMIN D PO Take by mouth.     No current facility-administered medications for this visit.    Family History  Problem Relation Age of Onset   Breast cancer Neg Hx     Review of Systems  All other systems reviewed  and are negative.   Exam:   BP 110/64   Pulse 62   Ht 5' 3.75" (1.619 m)   Wt 138 lb (62.6 kg)   LMP 03/09/2018   SpO2 100%   BMI 23.87 kg/m   Weight change: @WEIGHTCHANGE @ Height:   Height: 5' 3.75" (161.9 cm)  Ht Readings from Last 3 Encounters:  07/17/22 5' 3.75" (1.619 m)  06/21/21 5\' 4"  (1.626 m)  06/15/20 5\' 4"  (1.626 m)    General appearance: alert, cooperative and appears stated age Head: Normocephalic, without obvious abnormality, atraumatic Neck: no adenopathy, supple, symmetrical, trachea midline and thyroid normal to inspection and palpation Lungs: clear to auscultation bilaterally Cardiovascular: regular rate and rhythm Breasts: normal appearance, no masses or tenderness Abdomen: soft, non-tender; non distended,  no masses,  no organomegaly Extremities: extremities normal, atraumatic, no cyanosis or edema Skin: Skin color, texture, turgor normal. No rashes or lesions Lymph nodes: Cervical, supraclavicular, and axillary nodes normal. No abnormal inguinal nodes palpated Neurologic: Grossly normal   Pelvic: External genitalia:  no lesions              Urethra:  normal appearing urethra with no masses, tenderness or lesions  Bartholins and Skenes: normal                 Vagina: normal appearing vagina with normal color and discharge, no lesions              Cervix: no lesions               Bimanual Exam:  Uterus:  normal size, contour, position, consistency, mobility, non-tender              Adnexa: no mass, fullness, tenderness               Rectovaginal: Confirms               Anus:  normal sphincter tone, no lesions  Gae Dry, CMA chaperoned for the exam.  1. Well woman exam Discussed breast self exam Discussed calcium and vit D intake Labs with primary Mammogram and colonoscopy are scheduled  2. Screening for cervical cancer - Cytology - PAP  3. Colon cancer screening Colonoscopy scheduled

## 2022-07-17 ENCOUNTER — Encounter: Payer: Self-pay | Admitting: Obstetrics and Gynecology

## 2022-07-17 ENCOUNTER — Ambulatory Visit (INDEPENDENT_AMBULATORY_CARE_PROVIDER_SITE_OTHER): Payer: BC Managed Care – PPO | Admitting: Obstetrics and Gynecology

## 2022-07-17 ENCOUNTER — Other Ambulatory Visit (HOSPITAL_COMMUNITY)
Admission: RE | Admit: 2022-07-17 | Discharge: 2022-07-17 | Disposition: A | Discharge status: Home / Self Care | Payer: BC Managed Care – PPO | Source: Ambulatory Visit | Attending: Obstetrics and Gynecology | Admitting: Obstetrics and Gynecology

## 2022-07-17 VITALS — BP 110/64 | HR 62 | Ht 63.75 in | Wt 138.0 lb

## 2022-07-17 DIAGNOSIS — Z01419 Encounter for gynecological examination (general) (routine) without abnormal findings: Secondary | ICD-10-CM

## 2022-07-17 DIAGNOSIS — Z124 Encounter for screening for malignant neoplasm of cervix: Secondary | ICD-10-CM | POA: Diagnosis not present

## 2022-07-17 DIAGNOSIS — Z1211 Encounter for screening for malignant neoplasm of colon: Secondary | ICD-10-CM

## 2022-07-17 NOTE — Patient Instructions (Signed)

## 2022-07-20 ENCOUNTER — Ambulatory Visit: Payer: 59

## 2022-07-20 DIAGNOSIS — Z23 Encounter for immunization: Secondary | ICD-10-CM | POA: Diagnosis not present

## 2022-07-25 LAB — CYTOLOGY - PAP
Comment: NEGATIVE
Diagnosis: UNDETERMINED — AB
High risk HPV: NEGATIVE

## 2022-08-22 DIAGNOSIS — K648 Other hemorrhoids: Secondary | ICD-10-CM | POA: Diagnosis not present

## 2022-08-22 DIAGNOSIS — D128 Benign neoplasm of rectum: Secondary | ICD-10-CM | POA: Diagnosis not present

## 2022-08-22 DIAGNOSIS — Z1211 Encounter for screening for malignant neoplasm of colon: Secondary | ICD-10-CM | POA: Diagnosis not present

## 2022-08-27 ENCOUNTER — Ambulatory Visit: Payer: 59

## 2022-09-03 ENCOUNTER — Ambulatory Visit: Payer: 59

## 2022-11-06 ENCOUNTER — Ambulatory Visit
Admission: RE | Admit: 2022-11-06 | Discharge: 2022-11-06 | Disposition: A | Discharge status: Home / Self Care | Payer: BC Managed Care – PPO | Source: Ambulatory Visit | Attending: Obstetrics and Gynecology | Admitting: Obstetrics and Gynecology

## 2022-11-06 DIAGNOSIS — Z1231 Encounter for screening mammogram for malignant neoplasm of breast: Secondary | ICD-10-CM

## 2022-11-08 ENCOUNTER — Other Ambulatory Visit: Payer: Self-pay | Admitting: Obstetrics and Gynecology

## 2022-11-08 DIAGNOSIS — R928 Other abnormal and inconclusive findings on diagnostic imaging of breast: Secondary | ICD-10-CM

## 2022-11-17 ENCOUNTER — Ambulatory Visit
Admission: RE | Admit: 2022-11-17 | Discharge: 2022-11-17 | Disposition: A | Discharge status: Home / Self Care | Payer: BC Managed Care – PPO | Source: Ambulatory Visit | Attending: Obstetrics and Gynecology | Admitting: Obstetrics and Gynecology

## 2022-11-17 DIAGNOSIS — R928 Other abnormal and inconclusive findings on diagnostic imaging of breast: Secondary | ICD-10-CM

## 2022-11-17 DIAGNOSIS — R922 Inconclusive mammogram: Secondary | ICD-10-CM | POA: Diagnosis not present

## 2022-12-25 DIAGNOSIS — Z0000 Encounter for general adult medical examination without abnormal findings: Secondary | ICD-10-CM | POA: Diagnosis not present

## 2023-05-28 DIAGNOSIS — D225 Melanocytic nevi of trunk: Secondary | ICD-10-CM | POA: Diagnosis not present

## 2023-05-28 DIAGNOSIS — D2261 Melanocytic nevi of right upper limb, including shoulder: Secondary | ICD-10-CM | POA: Diagnosis not present

## 2023-05-28 DIAGNOSIS — D2372 Other benign neoplasm of skin of left lower limb, including hip: Secondary | ICD-10-CM | POA: Diagnosis not present

## 2023-06-26 DIAGNOSIS — H15111 Episcleritis periodica fugax, right eye: Secondary | ICD-10-CM | POA: Diagnosis not present

## 2023-07-10 DIAGNOSIS — H15111 Episcleritis periodica fugax, right eye: Secondary | ICD-10-CM | POA: Diagnosis not present

## 2023-07-22 ENCOUNTER — Ambulatory Visit: Payer: BC Managed Care – PPO | Admitting: Obstetrics and Gynecology

## 2023-08-27 DIAGNOSIS — Z01419 Encounter for gynecological examination (general) (routine) without abnormal findings: Secondary | ICD-10-CM | POA: Diagnosis not present

## 2023-10-14 ENCOUNTER — Other Ambulatory Visit: Payer: Self-pay | Admitting: Internal Medicine

## 2023-10-14 DIAGNOSIS — Z0000 Encounter for general adult medical examination without abnormal findings: Secondary | ICD-10-CM

## 2023-11-08 ENCOUNTER — Ambulatory Visit
Admission: RE | Admit: 2023-11-08 | Discharge: 2023-11-08 | Disposition: A | Discharge status: Home / Self Care | Payer: BC Managed Care – PPO | Source: Ambulatory Visit | Attending: Internal Medicine

## 2023-11-08 DIAGNOSIS — Z0000 Encounter for general adult medical examination without abnormal findings: Secondary | ICD-10-CM

## 2023-11-08 DIAGNOSIS — Z1231 Encounter for screening mammogram for malignant neoplasm of breast: Secondary | ICD-10-CM | POA: Diagnosis not present

## 2023-12-27 DIAGNOSIS — E7849 Other hyperlipidemia: Secondary | ICD-10-CM | POA: Diagnosis not present

## 2023-12-27 DIAGNOSIS — Z0000 Encounter for general adult medical examination without abnormal findings: Secondary | ICD-10-CM | POA: Diagnosis not present

## 2024-03-04 DIAGNOSIS — G43009 Migraine without aura, not intractable, without status migrainosus: Secondary | ICD-10-CM | POA: Diagnosis not present

## 2024-03-30 DIAGNOSIS — M7541 Impingement syndrome of right shoulder: Secondary | ICD-10-CM | POA: Diagnosis not present

## 2024-05-04 DIAGNOSIS — G43009 Migraine without aura, not intractable, without status migrainosus: Secondary | ICD-10-CM | POA: Diagnosis not present

## 2024-07-03 DIAGNOSIS — M7542 Impingement syndrome of left shoulder: Secondary | ICD-10-CM | POA: Diagnosis not present

## 2024-07-06 DIAGNOSIS — M7542 Impingement syndrome of left shoulder: Secondary | ICD-10-CM | POA: Diagnosis not present

## 2024-07-09 DIAGNOSIS — M7542 Impingement syndrome of left shoulder: Secondary | ICD-10-CM | POA: Diagnosis not present

## 2024-07-14 DIAGNOSIS — M7542 Impingement syndrome of left shoulder: Secondary | ICD-10-CM | POA: Diagnosis not present

## 2024-07-21 DIAGNOSIS — M7542 Impingement syndrome of left shoulder: Secondary | ICD-10-CM | POA: Diagnosis not present

## 2024-07-24 DIAGNOSIS — M7542 Impingement syndrome of left shoulder: Secondary | ICD-10-CM | POA: Diagnosis not present

## 2024-08-03 DIAGNOSIS — D225 Melanocytic nevi of trunk: Secondary | ICD-10-CM | POA: Diagnosis not present

## 2024-08-03 DIAGNOSIS — D2261 Melanocytic nevi of right upper limb, including shoulder: Secondary | ICD-10-CM | POA: Diagnosis not present

## 2024-08-03 DIAGNOSIS — D2372 Other benign neoplasm of skin of left lower limb, including hip: Secondary | ICD-10-CM | POA: Diagnosis not present

## 2024-08-13 DIAGNOSIS — M7542 Impingement syndrome of left shoulder: Secondary | ICD-10-CM | POA: Diagnosis not present

## 2024-09-21 DIAGNOSIS — Z01419 Encounter for gynecological examination (general) (routine) without abnormal findings: Secondary | ICD-10-CM | POA: Diagnosis not present

## 2024-10-02 DIAGNOSIS — M7542 Impingement syndrome of left shoulder: Secondary | ICD-10-CM | POA: Diagnosis not present

## 2024-11-06 ENCOUNTER — Other Ambulatory Visit: Payer: Self-pay | Admitting: Internal Medicine

## 2024-11-06 DIAGNOSIS — Z1231 Encounter for screening mammogram for malignant neoplasm of breast: Secondary | ICD-10-CM

## 2024-11-25 ENCOUNTER — Ambulatory Visit
Admission: RE | Admit: 2024-11-25 | Discharge: 2024-11-25 | Disposition: A | Source: Ambulatory Visit | Attending: Internal Medicine

## 2024-11-25 DIAGNOSIS — Z1231 Encounter for screening mammogram for malignant neoplasm of breast: Secondary | ICD-10-CM
